# Patient Record
Sex: Male | Born: 1950 | Race: White | Hispanic: No | Marital: Married | Smoking: Former smoker
Health system: Southern US, Community
[De-identification: ages and names within clinical notes are randomized; demographics above are authoritative.]

## PROBLEM LIST (undated history)

## (undated) DIAGNOSIS — E7800 Pure hypercholesterolemia, unspecified: Secondary | ICD-10-CM

## (undated) DIAGNOSIS — K219 Gastro-esophageal reflux disease without esophagitis: Secondary | ICD-10-CM

## (undated) DIAGNOSIS — M1990 Unspecified osteoarthritis, unspecified site: Secondary | ICD-10-CM

## (undated) DIAGNOSIS — L03115 Cellulitis of right lower limb: Secondary | ICD-10-CM

## (undated) DIAGNOSIS — I10 Essential (primary) hypertension: Secondary | ICD-10-CM

## (undated) DIAGNOSIS — T783XXA Angioneurotic edema, initial encounter: Secondary | ICD-10-CM

## (undated) DIAGNOSIS — N200 Calculus of kidney: Secondary | ICD-10-CM

## (undated) HISTORY — DX: Unspecified osteoarthritis, unspecified site: M19.90

## (undated) HISTORY — PX: BACK SURGERY: SHX140

## (undated) HISTORY — PX: TONSILLECTOMY: SUR1361

## (undated) HISTORY — DX: Essential (primary) hypertension: I10

## (undated) HISTORY — PX: LUMBAR DISC SURGERY: SHX700

---

## 1989-07-02 HISTORY — PX: CYSTOSCOPY W/ STONE MANIPULATION: SHX1427

## 1999-10-02 ENCOUNTER — Emergency Department (HOSPITAL_COMMUNITY): Admission: EM | Admit: 1999-10-02 | Discharge: 1999-10-02 | Payer: Self-pay | Admitting: Emergency Medicine

## 2002-08-18 ENCOUNTER — Emergency Department (HOSPITAL_COMMUNITY): Admission: EM | Admit: 2002-08-18 | Discharge: 2002-08-18 | Payer: Self-pay | Admitting: Emergency Medicine

## 2002-08-18 ENCOUNTER — Encounter: Payer: Self-pay | Admitting: Emergency Medicine

## 2007-05-18 ENCOUNTER — Encounter (INDEPENDENT_AMBULATORY_CARE_PROVIDER_SITE_OTHER): Payer: Self-pay | Admitting: Orthopedic Surgery

## 2007-05-18 ENCOUNTER — Ambulatory Visit (HOSPITAL_BASED_OUTPATIENT_CLINIC_OR_DEPARTMENT_OTHER): Admission: RE | Admit: 2007-05-18 | Discharge: 2007-05-18 | Payer: Self-pay | Admitting: Orthopedic Surgery

## 2009-11-24 ENCOUNTER — Ambulatory Visit: Payer: Self-pay | Admitting: Radiology

## 2009-11-24 ENCOUNTER — Emergency Department (HOSPITAL_BASED_OUTPATIENT_CLINIC_OR_DEPARTMENT_OTHER): Admission: EM | Admit: 2009-11-24 | Discharge: 2009-11-24 | Payer: Self-pay | Admitting: Emergency Medicine

## 2011-03-16 NOTE — Op Note (Signed)
[name], [name]              ACCOUNT NO.:  [id number]   MEDICAL RECORD NO.:  [id number]          PATIENT TYPE:  AMB   LOCATION:  DSC                          FACILITY:  [facility]   PHYSICIAN:  Cindee Salt, M.D.       DATE OF BIRTH:  1950/12/17   DATE OF PROCEDURE:  05/18/2007  DATE OF DISCHARGE:                               OPERATIVE REPORT   PREOPERATIVE DIAGNOSIS:  Mass left thumb with degenerative arthritis.   POSTOPERATIVE DIAGNOSIS:  Mass left thumb with degenerative arthritis.   OPERATION:  Excision mass left thumb.   SURGEON:  Cindee Salt, M.D.   ANESTHESIA:  General.   HISTORY:  The patient is a 60 year old male with a history of a mass on  the IP joint of his left thumb.  X-rays reveal degenerative changes of  the IP joint.  He states that this has opened and drained.  This does  transilluminate. 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 He is aware of risks and  complications including infection, recurrence, injury to arteries,  nerves, tendons, incomplete relief of symptoms, and dystrophy.  In the  preoperative area, the patient is seen, the extremity marked by both the  patient and surgeon, questions encouraged and answered.  Antibiotic is  given.   PROCEDURE:  The patient is brought to the operating room where a general  anesthetic was carried out without difficulty.  He was prepped using  DuraPrep, supine position, left arm free.  The limb was exsanguinated  with an Esmarch bandage and tourniquet placed on the forearm which was  inflated to 250 mmHg.  A curvilinear incision was made over the mass and  carried down through the subcutaneous tissue.  This was over the IP  joint.  Bleeders were electrocauterized.  The mass was immediately  encountered.  The stalk going to the skin was identified.  This was  maintained with the mass.  The mass was pearly white. This appeared to  be an epidermal inclusion cyst  rather than mucoid cyst. With blunt and  sharp dissection, it was dissected free.  The joint was not opened.  The  mass was sent to pathology after opening it which revealed, indeed, it  was an epidermal inclusion cyst rather than mucoid cyst.  The wound was  irrigated.  The skin was closed with interrupted 5-0 Vicryl Rapide  sutures.  A sterile compressive dressing was applied.  The patient  tolerated the procedure well and was taken to the recovery room for  observation in satisfactory condition.  He is discharged home to return  to the [facility] in one week on Vicodin.           ______________________________  Cindee Salt, M.D.     GK/MEDQ  D:  05/18/2007  T:  05/18/2007  Job:  [id number]   cc:   Lilyan Punt. Sydnee Levans, M.D.

## 2011-08-16 LAB — POCT HEMOGLOBIN-HEMACUE
Hemoglobin: 18.4 — ABNORMAL HIGH
Operator id: 128471

## 2011-08-16 LAB — BASIC METABOLIC PANEL
BUN: 18
CO2: 29
Calcium: 9.2
Chloride: 107
Creatinine, Ser: 0.84
GFR calc Af Amer: >60
GFR calc non Af Amer: >60
Glucose, Bld: 109 — ABNORMAL HIGH
Potassium: 5.5 — ABNORMAL HIGH
Sodium: 140

## 2013-11-05 ENCOUNTER — Encounter: Payer: Self-pay | Admitting: Family Medicine

## 2013-11-05 ENCOUNTER — Ambulatory Visit (INDEPENDENT_AMBULATORY_CARE_PROVIDER_SITE_OTHER): Payer: No Typology Code available for payment source | Admitting: Family Medicine

## 2013-11-05 VITALS — BP 144/70 | HR 66 | Temp 98.0°F | Resp 16 | Ht 70.5 in | Wt 199.4 lb

## 2013-11-05 DIAGNOSIS — T783XXS Angioneurotic edema, sequela: Secondary | ICD-10-CM

## 2013-11-05 DIAGNOSIS — G8929 Other chronic pain: Secondary | ICD-10-CM

## 2013-11-05 DIAGNOSIS — I10 Essential (primary) hypertension: Secondary | ICD-10-CM | POA: Insufficient documentation

## 2013-11-05 DIAGNOSIS — E7800 Pure hypercholesterolemia, unspecified: Secondary | ICD-10-CM

## 2013-11-05 DIAGNOSIS — M479 Spondylosis, unspecified: Secondary | ICD-10-CM

## 2013-11-05 DIAGNOSIS — M549 Dorsalgia, unspecified: Secondary | ICD-10-CM

## 2013-11-05 DIAGNOSIS — Z23 Encounter for immunization: Secondary | ICD-10-CM

## 2013-11-05 DIAGNOSIS — T783XXA Angioneurotic edema, initial encounter: Secondary | ICD-10-CM

## 2013-11-05 LAB — COMPREHENSIVE METABOLIC PANEL
ALT: 22 U/L (ref 0–53)
AST: 16 U/L (ref 0–37)
Albumin: 4.2 g/dL (ref 3.5–5.2)
Alkaline Phosphatase: 94 U/L (ref 39–117)
BILIRUBIN TOTAL: 0.4 mg/dL (ref 0.3–1.2)
BUN: 17 mg/dL (ref 6–23)
CO2: 26 mEq/L (ref 19–32)
Calcium: 9 mg/dL (ref 8.4–10.5)
Chloride: 103 mEq/L (ref 96–112)
Creat: 0.78 mg/dL (ref 0.50–1.35)
Glucose, Bld: 93 mg/dL (ref 70–99)
Potassium: 4 mEq/L (ref 3.5–5.3)
Sodium: 139 mEq/L (ref 135–145)
Total Protein: 7 g/dL (ref 6.0–8.3)

## 2013-11-05 MED ORDER — EPINEPHRINE 0.3 MG/0.3ML IJ SOAJ: 0.3000 mg | Freq: Once | INTRAMUSCULAR | Status: DC

## 2013-11-05 MED ORDER — AMLODIPINE BESYLATE 10 MG PO TABS: 10.0000 mg | ORAL_TABLET | Freq: Two times a day (BID) | ORAL | Status: DC

## 2013-11-05 MED ORDER — MELOXICAM 15 MG PO TABS: 15.0000 mg | ORAL_TABLET | Freq: Every day | ORAL | Status: DC

## 2013-11-05 MED ORDER — ATORVASTATIN CALCIUM 40 MG PO TABS: 40.0000 mg | ORAL_TABLET | Freq: Every day | ORAL | Status: DC

## 2013-11-05 NOTE — Progress Notes (Signed)
[facility] [address], [place] [address] [phone]  Date:  11/05/2013   Name:  Ray Patel   DOB:  01-23-1951   MRN:  [id number]  PCP:  No primary provider on file.    Chief Complaint: to get establish care and Medication Refill   History of Present Illness:  Ray Patel is a 63 y.o. very pleasant male patient who presents with the following:  Here today as a new patient.  He has had to get a new doctor due to insurance change.  He is a former [employer] patient but will transfer his care to [place].     He has a history of HTN, high cholesterol and arthritis.  Otherwise he is generally heathy.  He is married No tobacco, no drugs, no alcohol  He has had 2 back surgeries-first in the 70's and most recently in 1991 for herniated disks.  He uses meloxicam daily for back pain.   Last blood draw was about 6 months ago- per his recollection everything looked good then.     He does take norvasc 10 mg BID and has done so for a couple of years.  He has not suffered any ill effects from this  Home BP usually 130/70  He does also note occasional angioedema over the last few years.  He does not have an epi- pen. He has not noted any relationship to certain foods, meds or activities.  He will usually just have a little swelling or a couple of hives.  Once did have swelling of his tongue.  benadryl will help.    There are no active problems to display for this patient.   Past Medical History  Diagnosis Date  . Arthritis   . Hypertension     Past Surgical History  Procedure Laterality Date  . Spine surgery      History  Substance Use Topics  . Smoking status: Former Games developer  . Smokeless tobacco: Not on file  . Alcohol Use: No    Family History  Problem Relation Age of Onset  . Heart attack Mother   . COPD Brother     No Known Allergies  Medication list has been reviewed and updated.  No current outpatient prescriptions on file prior to  visit.   No current facility-administered medications on file prior to visit.    Review of Systems:  As per HPI- otherwise negative.   Physical Examination: Filed Vitals:   11/05/13 1108  BP: 144/70  Pulse: 66  Temp: 98 F (36.7 C)  Resp: 16   Filed Vitals:   11/05/13 1108  Height: 5' 10.5" (1.791 m)  Weight: 199 lb 6.4 oz (90.447 kg)   Body mass index is 28.2 kg/(m^2). Ideal Body Weight: Weight in (lb) to have BMI = 25: 176.4  GEN: WDWN, NAD, Non-toxic, A & O x 3, mild overweight, looks well HEENT: Atraumatic, Normocephalic. Neck supple. No masses, No LAD.  Bilateral TM wnl, oropharynx normal.  PEERL,EOMI.   Ears and Nose: No external deformity. CV: RRR, No M/G/R. No JVD. No thrill. No extra heart sounds. PULM: CTA B, no wheezes, crackles, rhonchi. No retractions. No resp. distress. No accessory muscle use. EXTR: No c/c/e NEURO Normal gait.  PSYCH: Normally interactive. Conversant. Not depressed or anxious appearing.  Calm demeanor.   Assessment and Plan: HTN (hypertension) - Plan: amLODipine (NORVASC) 10 MG tablet, Comprehensive metabolic panel  High cholesterol - Plan: atorvastatin (LIPITOR) 40 MG tablet, Comprehensive  metabolic panel  Degenerative joint disease of low back - Plan: meloxicam (MOBIC) 15 MG tablet  Angioedema, initial encounter - Plan: EPINEPHrine (EPIPEN) 0.3 mg/0.3 mL SOAJ injection  Flu shot today.  Refilled his cholesterol, HTN and arthritis meds Per his report his BP is generally well controlled at home Discussed the fact that the usual norvasc max dose is $RemoveBeforeD ID_[id number]$10mg also not sure if it is helping.  Suggested that he try taking just one a day and see if his BP does not go up Discussed occasional angioedema.  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  Gave rx for epipen and discussed when to use it.  For now he declines any further evaluation with allergist.   Signed Abbe AmsterdamJessica Rithika Seel, MD

## 2013-11-05 NOTE — Patient Instructions (Signed)
In general the maximum daily dosage of norvasc (amlodipine) is 10mg  a day.  While taking 20 is probably not harmful I am also not sure if you are getting more benefit from this dose.   You might try taking just 10mg  a day and see how your BP does.

## 2014-11-14 ENCOUNTER — Other Ambulatory Visit: Payer: Self-pay | Admitting: Family Medicine

## 2014-11-18 ENCOUNTER — Other Ambulatory Visit: Payer: Self-pay | Admitting: Family Medicine

## 2014-11-23 ENCOUNTER — Encounter (HOSPITAL_BASED_OUTPATIENT_CLINIC_OR_DEPARTMENT_OTHER): Payer: Self-pay | Admitting: *Deleted

## 2014-11-23 ENCOUNTER — Emergency Department (HOSPITAL_BASED_OUTPATIENT_CLINIC_OR_DEPARTMENT_OTHER)
Admission: EM | Admit: 2014-11-23 | Discharge: 2014-11-23 | Disposition: A | Discharge status: Home / Self Care | Payer: No Typology Code available for payment source | Attending: Emergency Medicine | Admitting: Emergency Medicine

## 2014-11-23 DIAGNOSIS — Z87891 Personal history of nicotine dependence: Secondary | ICD-10-CM | POA: Insufficient documentation

## 2014-11-23 DIAGNOSIS — K122 Cellulitis and abscess of mouth: Secondary | ICD-10-CM | POA: Diagnosis not present

## 2014-11-23 DIAGNOSIS — Z9109 Other allergy status, other than to drugs and biological substances: Secondary | ICD-10-CM | POA: Insufficient documentation

## 2014-11-23 DIAGNOSIS — I10 Essential (primary) hypertension: Secondary | ICD-10-CM | POA: Diagnosis not present

## 2014-11-23 DIAGNOSIS — Z87898 Personal history of other specified conditions: Secondary | ICD-10-CM

## 2014-11-23 DIAGNOSIS — Z79899 Other long term (current) drug therapy: Secondary | ICD-10-CM | POA: Diagnosis not present

## 2014-11-23 DIAGNOSIS — M1990 Unspecified osteoarthritis, unspecified site: Secondary | ICD-10-CM | POA: Diagnosis not present

## 2014-11-23 DIAGNOSIS — R220 Localized swelling, mass and lump, head: Secondary | ICD-10-CM | POA: Diagnosis present

## 2014-11-23 HISTORY — DX: Angioneurotic edema, initial encounter: T78.3XXA

## 2014-11-23 LAB — BASIC METABOLIC PANEL
ANION GAP: 1 — AB (ref 5–15)
BUN: 16 mg/dL (ref 6–23)
CO2: 27 mmol/L (ref 19–32)
Calcium: 7.8 mg/dL — ABNORMAL LOW (ref 8.4–10.5)
Chloride: 107 mmol/L (ref 96–112)
Creatinine, Ser: 0.88 mg/dL (ref 0.50–1.35)
GFR calc Af Amer: >90 mL/min (ref >90)
GFR calc non Af Amer: 88 mL/min — ABNORMAL LOW (ref >90)
Glucose, Bld: 126 mg/dL — ABNORMAL HIGH (ref 70–99)
Potassium: 3.3 mmol/L — ABNORMAL LOW (ref 3.5–5.1)
SODIUM: 135 mmol/L (ref 135–145)

## 2014-11-23 LAB — CBC WITH DIFFERENTIAL/PLATELET
BASOS ABS: 0 10*3/uL (ref 0.0–0.1)
Basophils Relative: 0 % (ref 0–1)
EOS ABS: 0.4 10*3/uL (ref 0.0–0.7)
Eosinophils Relative: 4 % (ref 0–5)
HEMATOCRIT: 43.5 % (ref 39.0–52.0)
Hemoglobin: 14.5 g/dL (ref 13.0–17.0)
LYMPHS PCT: 16 % (ref 12–46)
Lymphs Abs: 1.6 10*3/uL (ref 0.7–4.0)
MCH: 29.4 pg (ref 26.0–34.0)
MCHC: 33.3 g/dL (ref 30.0–36.0)
MCV: 88.1 fL (ref 78.0–100.0)
MONO ABS: 1.2 10*3/uL — AB (ref 0.1–1.0)
MONOS PCT: 11 % (ref 3–12)
NEUTROS ABS: 7 10*3/uL (ref 1.7–7.7)
Neutrophils Relative %: 69 % (ref 43–77)
Platelets: 206 10*3/uL (ref 150–400)
RBC: 4.94 MIL/uL (ref 4.22–5.81)
RDW: 13.3 % (ref 11.5–15.5)
WBC: 10.2 10*3/uL (ref 4.0–10.5)

## 2014-11-23 LAB — RAPID STREP SCREEN (MED CTR MEBANE ONLY): Streptococcus, Group A Screen (Direct): NEGATIVE

## 2014-11-23 MED ORDER — DIPHENHYDRAMINE HCL 50 MG/ML IJ SOLN
25.0000 mg | Freq: Once | INTRAMUSCULAR | Status: AC
  Administered 2014-11-23: 25 mg via INTRAVENOUS
  Filled 2014-11-23: qty 1

## 2014-11-23 MED ORDER — METHYLPREDNISOLONE SODIUM SUCC 125 MG IJ SOLR
125.0000 mg | Freq: Once | INTRAMUSCULAR | Status: AC
  Administered 2014-11-23: 125 mg via INTRAVENOUS
  Filled 2014-11-23: qty 2

## 2014-11-23 MED ORDER — FAMOTIDINE IN NACL 20-0.9 MG/50ML-% IV SOLN
20.0000 mg | Freq: Once | INTRAVENOUS | Status: AC
  Administered 2014-11-23: 20 mg via INTRAVENOUS
  Filled 2014-11-23: qty 50

## 2014-11-23 MED ORDER — PREDNISONE 10 MG PO TABS: 20.0000 mg | ORAL_TABLET | Freq: Two times a day (BID) | ORAL | Status: DC

## 2014-11-23 MED ORDER — EPINEPHRINE HCL 1 MG/ML IJ SOLN
INTRAMUSCULAR | Status: AC
  Administered 2014-11-23: 0.3 mg via SUBCUTANEOUS
  Filled 2014-11-23: qty 1

## 2014-11-23 MED ORDER — SODIUM CHLORIDE 0.9 % IV BOLUS (SEPSIS)
1000.0000 mL | Freq: Once | INTRAVENOUS | Status: AC
  Administered 2014-11-23: 1000 mL via INTRAVENOUS

## 2014-11-23 MED ORDER — EPINEPHRINE 0.3 MG/0.3ML IJ SOAJ
0.3000 mg | Freq: Once | INTRAMUSCULAR | Status: DC
  Filled 2014-11-23: qty 0.6

## 2014-11-23 NOTE — ED Notes (Signed)
C/o throat swelling, relates sx to cough & cold medicine, has had cold sx x1 week, (denies: itching, dizziness or other sx), alert, NAD, calm, interactive, speech altered, airway patent at this time, no drooling, handling secretions, resps e/u, no dyspnea noted, SPO2 low 90-93%. [facility] being established at this time. EDPA notfied and into room. meds ordered.

## 2014-11-23 NOTE — ED Provider Notes (Signed)
CSN: [id number]     Arrival date & time 11/23/14  2053 History   First MD Initiated Contact with Patient 11/23/14 2058     Chief Complaint  Patient presents with  . Allergic Reaction     (Consider location/radiation/quality/duration/timing/severity/associated sxs/prior Treatment) HPI   Ray Patel is a 64 y.o. male complaining of acute onset of throat swelling 45 minutes prior to arrival. Patient denies shortness of breath, rash, dyspepsia. Think this is an allergic reaction to Delsym which he had taken 2 hours prior to arrival. 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 Patient gave himself Benadryl, did not administer epinephrine. Patient states he has intermittent angioedema of different places. States that it normally starts in his left axillary area he's had it in his feet and his testicles sometimes he gets it on the left half of his tongue and lip. His never been given epinephrine in the past. 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 Patient states he's been sick for 2 weeks with runny nose and cough. He denies fever, chills, nausea, vomiting, pharyngitis.   Past Medical History  Diagnosis Date  . Arthritis   . Hypertension   . Angioedema    Past Surgical History  Procedure Laterality Date  . Spine surgery     Family History  Problem Relation Age of Onset  . Heart attack Mother   . COPD Brother    History  Substance Use Topics  . Smoking status: Former Games developer  . Smokeless tobacco: Not on file  . Alcohol Use: No    Review of Systems  10 systems reviewed and found to be negative, except as noted in the HPI.   Allergies  Delsym  Home Medications   Prior to Admission medications   Medication Sig Start Date End Date Taking? Authorizing Provider  amLODipine (NORVASC) 10 MG tablet take 1 tablet by mouth twice a day 11/14/14   Sherren Mocha, MD  atorvastatin (LIPITOR) 40 MG tablet Take 1 tablet (40 mg total) by mouth daily. PATIENT NEEDS  OFFICE VISIT/FASTING LABS FOR ADDITIONAL REFILLS 11/19/14   Pearline Cables, MD  EPINEPHrine (EPIPEN) 0.3 mg/0.3 mL SOAJ injection Inject 0.3 mLs (0.3 mg total) into the muscle once. 11/05/13   Gwenlyn Found Copland, MD  meloxicam (MOBIC) 15 MG tablet Take 1 tablet (15 mg total) by mouth daily. PATIENT NEEDS OFFICE VISIT FOR ADDITIONAL REFILLS 11/19/14   Pearline Cables, MD  PRESCRIPTION MEDICATION Amlodipine Besylate 20 mg taking bid    Historical Provider, MD   BP 145/69 mmHg  Pulse 92  Temp(Src) 98.7 F (37.1 C) (Oral)  Resp 20  Ht  (1.753 m)  Wt 205 lb (92.987 kg)  BMI 30.26 kg/m2  SpO2 93% Physical Exam  Constitutional: He is oriented to person, place, and time. He appears well-developed and well-nourished. No distress.  HENT:  Head: Normocephalic.  Mouth/Throat:    No drooling or stridor.   Soft palate and uvula are extremely edematous and there is swelling on the right posterior to the arch.   No erythema  No tonsillar hypertrophy    Eyes: Conjunctivae and EOM are normal.  Cardiovascular: Normal rate.   Pulmonary/Chest: Effort normal and breath sounds normal. No stridor. No respiratory distress. He has no wheezes. He has no rales. He exhibits no tenderness.  Musculoskeletal: Normal range of motion.  Neurological: He is alert and oriented to person, place, and time.  Psychiatric: He has a normal mood and affect.  Nursing note  and vitals reviewed.   ED Course  Procedures (including critical care time) Labs Review Labs Reviewed - No data to display  Imaging Review No results found.   EKG Interpretation None      MDM   Final diagnoses:  None    Filed Vitals:   11/23/14 2110 11/23/14 2114 11/23/14 2115 11/23/14 2130  BP:   156/72 155/66  Pulse: 78 79 79 79  Temp:      TempSrc:      Resp: 20 19 16 18   Height:      Weight:      SpO2: 92% 95% 95% 97%    Medications  sodium chloride 0.9 % bolus 1,000 mL (1,000 mLs Intravenous New Bag/Given 11/23/14  2109)  methylPREDNISolone sodium succinate (SOLU-MEDROL) 125 mg/2 mL injection 125 mg (125 mg Intravenous Given 11/23/14 2106)  diphenhydrAMINE (BENADRYL) injection 25 mg (25 mg Intravenous Given 11/23/14 2105)  famotidine (PEPCID) IVPB 20 mg (20 mg Intravenous New Bag/Given 11/23/14 2107)  EPINEPHrine (ADRENALIN) 1 MG/ML injection (0.3 mg Subcutaneous Given 11/23/14 2107)    Ray Patel is a pleasant 64 y.o. male presenting with moderate soft palate, uvula edema. Patient is in no respiratory distress. No secondary organ involvement however, considering the location of his symptoms I will give epinephrine in addition to Benadryl, Pepcid, Solu-Medrol.  Allergy cocktail including IM epinephrine did not significantly improve his edema. Patient's rapid strep is negative, CBC with no leukocytosis.  Care remains with Dr. Judd Lienelo at shift change: He will reassess        Wynetta Emeryicole Arlet Marter, PA-C 11/23/14 2203  Geoffery Lyonsouglas Delo, MD 11/24/14 [phone]

## 2014-11-23 NOTE — ED Notes (Signed)
Pt placed on continuous spo2 monitoring and given call bell,  Notified him to call with any worsening in condition.  Pt took 25mg  benadryl 30min pta

## 2014-11-23 NOTE — ED Notes (Signed)
States, "feel about the same and a little better". EDP & EDPA in to see pt. No changes. Alert, NAD, calm, interactive, speech clear, remains stoic. VSS. No dyspnea noted.

## 2014-11-23 NOTE — Discharge Instructions (Signed)
Prednisone as prescribed.  Benadryl 25 mg every 6 hours for the next 3 days.  Return to the emergency department for difficulty breathing, an inability to swallow swallow, or for other new and concerning symptoms.   Uvulitis Uvulitis is redness and soreness (inflammation) of the uvula. The uvula is the small tongue-shaped piece of tissue in the back of your mouth.  CAUSES Infection is a common cause of uvulitis. Infection of the uvula can be either viral or bacterial. Infectious uvulitis usually only occurs in association with another condition, such as inflammation and infection of the mouth or throat. Other causes of uvulitis include:  Trauma to the uvula.  Swelling from excess fluid buildup (edema), which may be an allergic reaction.  Inhalation of irritants, such as chemical agents, smoke, or steam. DIAGNOSIS Your caregiver can usually diagnose uvulitis through a physical examination. Bacterial uvulitis can be diagnosed through the results of the growth of samples of bodily substances taken from your mouth (cultures). HOME CARE INSTRUCTIONS   Rest as much as possible.  Young children may suck on frozen juice bars or frozen ice pops. Older children and adults may gargle with a warm or cold liquid to help soothe the throat. (Mix  tsp of salt in 8 oz of water, or use strong tea.)  Use a cool-mist humidifier to lessen throat irritation and cough.  Drink enough fluids to keep your urine clear or pale yellow.  While the throat is very sore, eat soft or liquid foods such as milk, ice cream, soups, or milk drinks.  Family members who develop a sore throat or fever should have a medical exam or throat culture.  If your child has uvulitis and is taking antibiotic medicine, wait 24 hours or until his or her temperature is near normal (less than 100 F [37.8 C]) before allowing him or her to return to school or day care.  Only take over-the-counter or prescription medicines for pain,  discomfort, or fever as directed by your caregiver. Ask when your test results will be ready. Make sure you get your test results. SEEK MEDICAL CARE IF:   You have an oral temperature above 102 F (38.9 C).  You develop large, tender lumps your the neck.  Your child develops a rash.  You cough up green, yellow-brown, or bloody substances. SEEK IMMEDIATE MEDICAL CARE IF:   You develop any new symptoms, such as vomiting, earache, severe headache, stiff neck, chest pain, or trouble breathing or swallowing.  Your airway is blocked.  You develop more severe throat pain along with drooling or voice changes. Document Released: 05/28/2004 Document Revised: 01/10/2012 Document Reviewed: 12/24/2010 [facility] Patient Information 2015 [place], [place]. This information is not intended to replace advice given to you by your health care provider. Make sure you discuss any questions you have with your health care provider.

## 2014-11-23 NOTE — ED Notes (Signed)
Pt here with "throat swelling".  Pt has swelling that can be seen in her right back of throat.  No acute distress.  Pt notes that he has hx of angioedema and notes also that he took delsym today

## 2014-11-25 ENCOUNTER — Ambulatory Visit (INDEPENDENT_AMBULATORY_CARE_PROVIDER_SITE_OTHER): Payer: No Typology Code available for payment source

## 2014-11-25 ENCOUNTER — Encounter: Payer: Self-pay | Admitting: Family Medicine

## 2014-11-25 ENCOUNTER — Ambulatory Visit (INDEPENDENT_AMBULATORY_CARE_PROVIDER_SITE_OTHER): Payer: No Typology Code available for payment source | Admitting: Family Medicine

## 2014-11-25 ENCOUNTER — Other Ambulatory Visit: Payer: Self-pay | Admitting: Physician Assistant

## 2014-11-25 VITALS — BP 146/73 | HR 71 | Temp 97.9°F | Resp 16 | Ht 71.0 in | Wt 197.0 lb

## 2014-11-25 DIAGNOSIS — R0989 Other specified symptoms and signs involving the circulatory and respiratory systems: Secondary | ICD-10-CM

## 2014-11-25 DIAGNOSIS — Z23 Encounter for immunization: Secondary | ICD-10-CM

## 2014-11-25 DIAGNOSIS — G8929 Other chronic pain: Secondary | ICD-10-CM

## 2014-11-25 DIAGNOSIS — R059 Cough, unspecified: Secondary | ICD-10-CM

## 2014-11-25 DIAGNOSIS — T783XXA Angioneurotic edema, initial encounter: Secondary | ICD-10-CM

## 2014-11-25 DIAGNOSIS — M549 Dorsalgia, unspecified: Secondary | ICD-10-CM

## 2014-11-25 DIAGNOSIS — I10 Essential (primary) hypertension: Secondary | ICD-10-CM

## 2014-11-25 DIAGNOSIS — E7800 Pure hypercholesterolemia, unspecified: Secondary | ICD-10-CM

## 2014-11-25 DIAGNOSIS — Z125 Encounter for screening for malignant neoplasm of prostate: Secondary | ICD-10-CM

## 2014-11-25 DIAGNOSIS — R05 Cough: Secondary | ICD-10-CM

## 2014-11-25 DIAGNOSIS — R739 Hyperglycemia, unspecified: Secondary | ICD-10-CM

## 2014-11-25 DIAGNOSIS — T783XXS Angioneurotic edema, sequela: Secondary | ICD-10-CM

## 2014-11-25 LAB — COMPLETE METABOLIC PANEL WITH GFR
ALBUMIN: 3.9 g/dL (ref 3.5–5.2)
ALT: 23 U/L (ref 0–53)
AST: 19 U/L (ref 0–37)
Alkaline Phosphatase: 83 U/L (ref 39–117)
BUN: 22 mg/dL (ref 6–23)
CALCIUM: 9.2 mg/dL (ref 8.4–10.5)
CO2: 23 mEq/L (ref 19–32)
Chloride: 101 mEq/L (ref 96–112)
Creat: 0.72 mg/dL (ref 0.50–1.35)
GFR, Est African American: >89 mL/min
GLUCOSE: 121 mg/dL — AB (ref 70–99)
Potassium: 3.7 mEq/L (ref 3.5–5.3)
Sodium: 138 mEq/L (ref 135–145)
Total Bilirubin: 0.3 mg/dL (ref 0.2–1.2)
Total Protein: 7.2 g/dL (ref 6.0–8.3)

## 2014-11-25 LAB — LIPID PANEL
CHOL/HDL RATIO: 4.2 ratio
Cholesterol: 158 mg/dL (ref 0–200)
HDL: 38 mg/dL — ABNORMAL LOW (ref >39)
LDL Cholesterol: 96 mg/dL (ref 0–99)
Triglycerides: 119 mg/dL (ref <150)
VLDL: 24 mg/dL (ref 0–40)

## 2014-11-25 LAB — CULTURE, GROUP A STREP

## 2014-11-25 MED ORDER — ALBUTEROL SULFATE (2.5 MG/3ML) 0.083% IN NEBU: 2.5000 mg | INHALATION_SOLUTION | Freq: Once | RESPIRATORY_TRACT | Status: DC

## 2014-11-25 MED ORDER — IPRATROPIUM BROMIDE 0.02 % IN SOLN: 0.5000 mg | Freq: Once | RESPIRATORY_TRACT | Status: DC

## 2014-11-25 MED ORDER — MELOXICAM 15 MG PO TABS: 15.0000 mg | ORAL_TABLET | Freq: Every day | ORAL | Status: DC

## 2014-11-25 MED ORDER — AMLODIPINE BESYLATE 10 MG PO TABS: 10.0000 mg | ORAL_TABLET | Freq: Every day | ORAL | Status: DC

## 2014-11-25 MED ORDER — EPINEPHRINE 0.3 MG/0.3ML IJ SOAJ: 0.3000 mg | Freq: Once | INTRAMUSCULAR | Status: AC

## 2014-11-25 MED ORDER — DOXYCYCLINE HYCLATE 100 MG PO CAPS: 100.0000 mg | ORAL_CAPSULE | Freq: Two times a day (BID) | ORAL | Status: DC

## 2014-11-25 MED ORDER — HYDROCHLOROTHIAZIDE 25 MG PO TABS
12.5000 mg | ORAL_TABLET | Freq: Every day | ORAL | Status: DC
Start: 2014-11-25 — End: 2015-05-11

## 2014-11-25 MED ORDER — BENZONATATE 100 MG PO CAPS: 100.0000 mg | ORAL_CAPSULE | Freq: Three times a day (TID) | ORAL | Status: DC | PRN

## 2014-11-25 MED ORDER — ATORVASTATIN CALCIUM 40 MG PO TABS: 40.0000 mg | ORAL_TABLET | Freq: Every day | ORAL | Status: DC

## 2014-11-25 MED ORDER — ALBUTEROL SULFATE HFA 108 (90 BASE) MCG/ACT IN AERS: 2.0000 | INHALATION_SPRAY | RESPIRATORY_TRACT | Status: DC | PRN

## 2014-11-25 NOTE — Progress Notes (Signed)
Subjective:    Patient ID: Ray Patel, male    DOB: [birth year], 64 y.o.   MRN: [id number]  PCP: No primary care provider on file.  Chief Complaint  Patient presents with  . Medication Refill  . Cough    2 weeks   Patient Active Problem List   Diagnosis Date Noted  . Essential hypertension, benign 11/05/2013  . High cholesterol 11/05/2013  . Chronic back pain 11/05/2013  . Angioedema 11/05/2013   Prior to Admission medications   Medication Sig Start Date End Date Taking? Authorizing Provider  amLODipine (NORVASC) 10 MG tablet Take 1 tablet (10 mg total) by mouth daily. 11/25/14  Yes Bergen Magner, PA  atorvastatin (LIPITOR) 40 MG tablet Take 1 tablet (40 mg total) by mouth daily. 11/25/14  Yes Mahogani Holohan, PA  EPINEPHrine 0.3 mg/0.3 mL IJ SOAJ injection Inject 0.3 mLs (0.3 mg total) into the muscle once. 11/25/14  Yes Kymari Lollis, PA  hydrochlorothiazide (HYDRODIURIL) 25 MG tablet Take 0.5 tablets (12.5 mg total) by mouth daily. 11/25/14  Yes Kimmora Risenhoover, PA  meloxicam (MOBIC) 15 MG tablet Take 1 tablet (15 mg total) by mouth daily. 11/25/14  Yes Theda Payer, PA  predniSONE (DELTASONE) 10 MG tablet Take 2 tablets (20 mg total) by mouth 2 (two) times daily. 11/23/14  Yes Geoffery Lyons, MD  albuterol (PROVENTIL HFA;VENTOLIN HFA) 108 (90 BASE) MCG/ACT inhaler Inhale 2 puffs into the lungs every 4 (four) hours as needed for wheezing or shortness of breath (cough, shortness of breath or wheezing.). 11/25/14   Raelyn Ensign, PA  benzonatate (TESSALON PERLES) 100 MG capsule Take 1 capsule (100 mg total) by mouth 3 (three) times daily as needed for cough. 11/25/14   Raelyn Ensign, PA  doxycycline (VIBRAMYCIN) 100 MG capsule Take 1 capsule (100 mg total) by mouth 2 (two) times daily. 11/25/14   Raelyn Ensign, PA   Medications, allergies, past medical history, surgical history, family history, social history and problem list reviewed and updated.  HPI  23 yom with pmh htn, high cho, and  angioedema presents for med refill.  Has been doing well since we last saw him one year ago. Back has been holding up. 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   When we saw him one year ago he was taking 10 amlodipine twice daily per his previous pcp. He was instructed to decrease to 10 qd. Today he states he has continued to take it bid. He checks his bp both at home and at [facility]. Typically runs 140s-150s. For his job he states needs to have it below 140. BP 146/73 today.   He has had uri past 2 wks that he hasn't been able to get rid of. Mildly prod cough, congestion, rhinorrhea. O2 sat 93% today. He took delysm for it 2 days ago and thinks he had rxn to the med as he has hx of angioedema. He went to ER 2 days ago, had uvular swelling. Tx with steroids, benadryl, epi, and pepcid.   Due for pneumonia, flu, and zoster vaccines. Due for colonoscopy. He is agreeable to flu vaccine but wishes to wait to further discuss the others along with the colonoscopy.   He would like an allergist referral for the angioedema.   Review of Systems No cp, sob, palps, presyncope, syncope, fever, chills.     Objective:   Physical Exam  Constitutional: He is oriented to person, place, and time. He appears well-developed and well-nourished.  Non-toxic appearance. He does not have a sickly appearance. He does not appear ill. No distress.  BP 146/73 mmHg  Pulse 71  Temp(Src) 97.9 F (36.6 C) (Oral)  Resp 16  Ht 5\' 11"  (1.803 m)  Wt 197 lb (89.359 kg)  BMI 27.49 kg/m2  SpO2 93%   HENT:  Right Ear: Tympanic membrane normal.  Left Ear: Tympanic membrane normal.  Nose: Nose normal. Right sinus exhibits no maxillary sinus tenderness and no frontal sinus tenderness. Left sinus exhibits no maxillary sinus tenderness and no frontal sinus tenderness.  Mouth/Throat: Uvula is midline and oropharynx is clear and moist. No oropharyngeal exudate, posterior oropharyngeal edema, posterior  oropharyngeal erythema or tonsillar abscesses.  Neck: Normal range of motion. No JVD present. Carotid bruit is not present.  Cardiovascular: Normal rate, regular rhythm and normal heart sounds.  Exam reveals no gallop.   No murmur heard. Pulmonary/Chest: Effort normal. No accessory muscle usage. No tachypnea. No respiratory distress. He has no decreased breath sounds. He has wheezes in the right lower field and the left lower field. He has rhonchi in the right upper field, the right middle field, the right lower field, the left upper field, the left middle field and the left lower field. He has no rales.  Lymphadenopathy:       Head (right side): No submental, no submandibular and no tonsillar adenopathy present.       Head (left side): No submental, no submandibular and no tonsillar adenopathy present.    He has no cervical adenopathy.  Neurological: He is alert and oriented to person, place, and time.  Psychiatric: He has a normal mood and affect. His speech is normal and behavior is normal.   Albuterol/atrovent neb: Post neb breath sounds with continued diffuse rhonchi and bibasilar wheezing.  2nd albuterol/atrovent neb: Post neb breath sounds with continued diffuse rhonchi, no wheezes. O2 sat 99%.   [facility] reading (PRIMARY) by  Dr. Patsy Lageropland. Findings: Increased haziness right lower lobe. No other acute abnormality.      Assessment & Plan:   [age] yom with pmh htn, high cho, and angioedema presents for med refill.  Abnormal lung sounds - Plan: albuterol (PROVENTIL) (2.5 MG/3ML) 0.083% nebulizer solution 2.5 mg, ipratropium (ATROVENT) nebulizer solution 0.5 mg, albuterol (PROVENTIL) (2.5 MG/3ML) 0.083% nebulizer solution 2.5 mg, ipratropium (ATROVENT) nebulizer solution 0.5 mg, DG Chest 2 View, doxycycline (VIBRAMYCIN) 100 MG capsule, albuterol (PROVENTIL HFA;VENTOLIN HFA) 108 (90 BASE) MCG/ACT inhaler Cough - Plan: benzonatate (TESSALON PERLES) 100 MG capsule, doxycycline (VIBRAMYCIN) 100 MG  capsule --2 nebs today with improved lung sounds and 02 sat --albuterol prn for home for wheezing --diffuse rhonchi, possible hazy area on cxr rll, initial 02 sat 93%, 2 wks sx --> tx with doxy 10 days --tessalon for cough --discuss pneumonia vaccine next visit --rtc 4-5 days if not improving  Angioedema, sequela - Plan: Ambulatory referral to Allergy --sent in refill script for epipen  Need for prophylactic vaccination and inoculation against influenza - Plan: Flu Vaccine QUAD 36+ mos IM  High cholesterol - Plan: atorvastatin (LIPITOR) 40 MG tablet, Lipid panel  Essential hypertension - Plan: amLODipine (NORVASC) 10 MG tablet, hydrochlorothiazide (HYDRODIURIL) 25 MG tablet, COMPLETE METABOLIC PANEL WITH GFR --decrease amlodipine to 10 mg qd --start hctz 12.5 mg qd but await labs as last k was decreased, pt instructed to take bp at home and record 3x/week --increase hctz to 25 mg qd if running above 140/90 --no ace/arb at this time with hx angioedema  Chronic back  pain - Plan: meloxicam (MOBIC) 15 MG tablet  Screening for prostate cancer - Plan: PSA  Donnajean Lopes, PA-C Physician Assistant-Certified [facility]  11/25/2014 6:04 PM

## 2014-11-25 NOTE — Patient Instructions (Addendum)
We referred you to an allergist today. They will be in contact with you to schedule an appointment.  You received the flu vaccine today.  We refilled your medications today. Please take the amlodipine only once daily.  Please don't start taking the hctz until we get your labs back and tell you this is ok. We drew 3 labs today and will be in touch in a couple days with these. If your labs are normal and you start the hctz, we may increase this to 25 mg once daily for better bp control depending on if your top number is running above 140. It may help to pick up a new bp cuff for home so you can monitor this a few times a week.  Please take the cough medicine up to every 8 hours as needed for cough. You got 2 breathing treatments today since your lungs sounded abnormal.  Your chest xray showed a possible small pneumonia on the right side. We will await the radiology read. Please take the doxycycline twice daily for 10 days.  Please come back to clinic in 4-5 days if you're not feeling better with the cough.  We'll plan to see you back in 6 months for follow up. Possible pneumonia vaccine at that time along with colonoscopy referral.

## 2014-11-26 LAB — HEMOGLOBIN A1C
Hgb A1c MFr Bld: 6.2 % — ABNORMAL HIGH (ref <5.7)
Mean Plasma Glucose: 131 mg/dL — ABNORMAL HIGH (ref <117)

## 2014-11-26 LAB — PSA: PSA: 1.24 ng/mL (ref <=4.00)

## 2015-01-04 ENCOUNTER — Other Ambulatory Visit: Payer: Self-pay | Admitting: Family Medicine

## 2015-01-11 ENCOUNTER — Encounter: Payer: Self-pay | Admitting: Family Medicine

## 2015-01-15 ENCOUNTER — Telehealth: Payer: Self-pay

## 2015-01-15 NOTE — Telephone Encounter (Signed)
Called [facility] to add addition refills per Dr. Cyndie Chimeopland's note. Pt notified.

## 2015-01-15 NOTE — Telephone Encounter (Addendum)
Pt would like to speak with someone regarding his medication, states he had 11 refills but was denied when he went to the pharmacy Please call [phone]. It is his AMLODIPINE    [facility] ON [address]

## 2015-03-06 ENCOUNTER — Telehealth: Payer: Self-pay

## 2015-03-06 NOTE — Telephone Encounter (Signed)
Pharm faxed req to change Rx for HCTZ 25 mg 1/2 tablet BID, to 12.5 mg 1 tab BID per pt request. When I checked OV notes from 11/25/14 [place] instr's [place] are copied here: --start hctz 12.5 mg qd but await labs as last k was decreased, pt instructed to take bp at home and record 3x/week --increase hctz to 25 mg qd if running above 140/90  LMOM for pt to CB. We need to know how pt has been taking, if he started at 1/2 tab of 25 mg QD and was getting BP readings over 140/90? If so, instr's were to increase to 25 mg QD, not to continue cutting in half and taking BID. What are pt's current BP readings on what he is taking now?

## 2015-04-21 ENCOUNTER — Emergency Department
Admission: EM | Admit: 2015-04-21 | Discharge: 2015-04-21 | Disposition: A | Discharge status: Home / Self Care | Payer: Worker's Compensation | Attending: Emergency Medicine | Admitting: Emergency Medicine

## 2015-04-21 ENCOUNTER — Encounter: Payer: Self-pay | Admitting: *Deleted

## 2015-04-21 DIAGNOSIS — S81811A Laceration without foreign body, right lower leg, initial encounter: Secondary | ICD-10-CM | POA: Insufficient documentation

## 2015-04-21 DIAGNOSIS — S0101XA Laceration without foreign body of scalp, initial encounter: Secondary | ICD-10-CM | POA: Diagnosis not present

## 2015-04-21 DIAGNOSIS — Z791 Long term (current) use of non-steroidal anti-inflammatories (NSAID): Secondary | ICD-10-CM | POA: Diagnosis not present

## 2015-04-21 DIAGNOSIS — Y9289 Other specified places as the place of occurrence of the external cause: Secondary | ICD-10-CM | POA: Diagnosis not present

## 2015-04-21 DIAGNOSIS — Y990 Civilian activity done for income or pay: Secondary | ICD-10-CM | POA: Insufficient documentation

## 2015-04-21 DIAGNOSIS — W01198A Fall on same level from slipping, tripping and stumbling with subsequent striking against other object, initial encounter: Secondary | ICD-10-CM | POA: Insufficient documentation

## 2015-04-21 DIAGNOSIS — Y9389 Activity, other specified: Secondary | ICD-10-CM | POA: Diagnosis not present

## 2015-04-21 DIAGNOSIS — Z792 Long term (current) use of antibiotics: Secondary | ICD-10-CM | POA: Diagnosis not present

## 2015-04-21 DIAGNOSIS — Z87891 Personal history of nicotine dependence: Secondary | ICD-10-CM | POA: Diagnosis not present

## 2015-04-21 DIAGNOSIS — I10 Essential (primary) hypertension: Secondary | ICD-10-CM | POA: Diagnosis not present

## 2015-04-21 DIAGNOSIS — Z79899 Other long term (current) drug therapy: Secondary | ICD-10-CM | POA: Diagnosis not present

## 2015-04-21 DIAGNOSIS — Z7952 Long term (current) use of systemic steroids: Secondary | ICD-10-CM | POA: Diagnosis not present

## 2015-04-21 DIAGNOSIS — Z23 Encounter for immunization: Secondary | ICD-10-CM | POA: Insufficient documentation

## 2015-04-21 MED ORDER — BACITRACIN ZINC 500 UNIT/GM EX OINT
TOPICAL_OINTMENT | CUTANEOUS | Status: AC
  Administered 2015-04-21: 1
  Filled 2015-04-21: qty 1.8

## 2015-04-21 MED ORDER — LIDOCAINE-EPINEPHRINE (PF) 1 %-1:200000 IJ SOLN
INTRAMUSCULAR | Status: AC
  Administered 2015-04-21: 30 mL
  Filled 2015-04-21: qty 30

## 2015-04-21 MED ORDER — TETANUS-DIPHTH-ACELL PERTUSSIS 5-2.5-18.5 LF-MCG/0.5 IM SUSP
INTRAMUSCULAR | Status: AC
  Administered 2015-04-21: 1 mL via INTRAMUSCULAR
  Filled 2015-04-21: qty 0.5

## 2015-04-21 NOTE — ED Provider Notes (Signed)
[facility] Emergency Department Provider Note  ____________________________________________  Time seen: 9:50 AM  I have reviewed the triage vital signs and the nursing notes.   HISTORY  Chief Complaint Head Laceration    HPI Ray Patel is a 64 y.o. male reports a mechanical trip and fall at work today. He hit his right shin and the right side of his forehead causing lacerations and bleeding. He denies any dizziness neck pain headache or syncope. He did not not have any loss of consciousness. Denies any vision changes nausea vomiting numbness tingling or weakness at this time. He otherwise feels fine except for pain at the laceration sites. He is able to bear weight on his right leg and has normal range of motion.     Past Medical History  Diagnosis Date  . Arthritis   . Hypertension   . Angioedema     Patient Active Problem List   Diagnosis Date Noted  . Essential hypertension, benign 11/05/2013  . High cholesterol 11/05/2013  . Chronic back pain 11/05/2013  . Angioedema 11/05/2013    Past Surgical History  Procedure Laterality Date  . Spine surgery      Current Outpatient Rx  Name  Route  Sig  Dispense  Refill  . albuterol (PROVENTIL HFA;VENTOLIN HFA) 108 (90 BASE) MCG/ACT inhaler   Inhalation   Inhale 2 puffs into the lungs every 4 (four) hours as needed for wheezing or shortness of breath (cough, shortness of breath or wheezing.).   1 Inhaler   1   . amLODipine (NORVASC) 10 MG tablet   Oral   Take 1 tablet (10 mg total) by mouth daily.   30 tablet   11   . atorvastatin (LIPITOR) 40 MG tablet   Oral   Take 1 tablet (40 mg total) by mouth daily.   30 tablet   11   . benzonatate (TESSALON PERLES) 100 MG capsule   Oral   Take 1 capsule (100 mg total) by mouth 3 (three) times daily as needed for cough.   20 capsule   0   . doxycycline (VIBRAMYCIN) 100 MG capsule   Oral   Take 1 capsule (100 mg total) by mouth 2 (two)  times daily.   20 capsule   0   . EPINEPHrine 0.3 mg/0.3 mL IJ SOAJ injection   Intramuscular   Inject 0.3 mLs (0.3 mg total) into the muscle once.   2 Device   2   . hydrochlorothiazide (HYDRODIURIL) 25 MG tablet   Oral   Take 0.5 tablets (12.5 mg total) by mouth daily.   90 tablet   3   . meloxicam (MOBIC) 15 MG tablet   Oral   Take 1 tablet (15 mg total) by mouth daily.   30 tablet   11   . predniSONE (DELTASONE) 10 MG tablet   Oral   Take 2 tablets (20 mg total) by mouth 2 (two) times daily.   20 tablet   0     Allergies Delsym  Family History  Problem Relation Age of Onset  . Heart attack Mother   . COPD Brother     Social History History  Substance Use Topics  . Smoking status: Former Games developer  . Smokeless tobacco: Not on file  . Alcohol Use: No    Review of Systems  Constitutional: No fever or chills. No weight changes Eyes:No blurry vision or double vision.  ENT: No sore throat. Cardiovascular: No chest pain. Respiratory: No  dyspnea or cough. Gastrointestinal: Negative for abdominal pain, vomiting and diarrhea.  No BRBPR or melena. Genitourinary: Negative for dysuria, urinary retention, bloody urine, or difficulty urinating. Musculoskeletal: Negative for back pain. No joint swelling or pain. Skin: Negative for rash. Neurological: Negative for headaches, focal weakness or numbness. Psychiatric:No anxiety or depression.   Endocrine:No hot/cold intolerance, changes in energy, or sleep difficulty.  10-point ROS otherwise negative.  ____________________________________________   PHYSICAL EXAM:  VITAL SIGNS: ED Triage Vitals  Enc Vitals Group     BP 04/21/15 0958 163/85 mmHg     Pulse Rate 04/21/15 0958 65     Resp --      Temp 04/21/15 0958 98 F (36.7 C)     Temp Source 04/21/15 0958 Oral     SpO2 04/21/15 0958 97 %     Weight 04/21/15 0958 218 lb (98.884 kg)     Height --      Head Cir --      Peak Flow --      Pain Score  04/21/15 0959 7     Pain Loc --      Pain Edu? --      Excl. in GC? --      Constitutional: Alert and oriented. Well appearing and in no distress. Eyes: No scleral icterus. No conjunctival pallor. PERRL. EOMI ENT   Head: Normocephalic with 5 cm linear laceration running vertically on the right forehead. It does not involve the hair line or the eyebrow. The wound is hemostatic after holding direct pressure for 1 minute   Nose: No congestion/rhinnorhea. No septal hematoma   Mouth/Throat: MMM, no pharyngeal erythema. No peritonsillar mass. No uvula shift.   Neck: No stridor. No SubQ emphysema. No meningismus. No midline tenderness, full range of motion Hematological/Lymphatic/Immunilogical: No cervical lymphadenopathy. Cardiovascular: RRR. Normal and symmetric distal pulses are present in all extremities. No murmurs, rubs, or gallops. Respiratory: Normal respiratory effort without tachypnea nor retractions. Breath sounds are clear and equal bilaterally. No wheezes/rales/rhonchi. Gastrointestinal: Soft and nontender. No distention. There is no CVA tenderness.  No rebound, rigidity, or guarding. Genitourinary: deferred Musculoskeletal: Nontender with normal range of motion in all extremities. No joint effusions.  No lower extremity tenderness.  No edema. 2 cm linear laceration overlying the anterior midshaft tibia. No deformity no bony tenderness hemostatic. Neurologic:   Normal speech and language.  CN 2-10 normal. Motor grossly intact. No pronator drift.  Normal gait. No gross focal neurologic deficits are appreciated.  Skin:  Skin is warm, dry and intact. No rash noted.  No petechiae, purpura, or bullae. Psychiatric: Mood and affect are normal. Speech and behavior are normal. Patient exhibits appropriate insight and judgment.  ____________________________________________    LABS (pertinent positives/negatives) (all labs ordered are listed, but only abnormal results are  displayed) Labs Reviewed - No data to display ____________________________________________   EKG    ____________________________________________    RADIOLOGY    ____________________________________________   PROCEDURES LACERATION REPAIR Performed by: Sharman Cheek Authorized by: Sharman Cheek Consent: Verbal consent obtained. Risks and benefits: risks, benefits and alternatives were discussed Consent given by: patient Patient identity confirmed: provided demographic data Prepped and Draped in normal sterile fashion Wound explored to base and bloodless field, no foreign body identified  Laceration Location: Right forehead  Laceration Length: 5 cm  No Foreign Bodies seen or palpated  Anesthesia: local infiltration  Local anesthetic: lidocaine 1 % with epinephrine  Anesthetic total: 3 ml  Irrigation method: syringe Amount of cleaning: standard  Skin closure: Monocryl   Number of sutures: 3   Technique: Simple interrupted   Patient tolerance: Patient tolerated the procedure well with no immediate complications.    LACERATION REPAIR Performed by: Sharman Cheek Authorized by: Sharman Cheek Consent: Verbal consent obtained. Risks and benefits: risks, benefits and alternatives were discussed Consent given by: patient Patient identity confirmed: provided demographic data Prepped and Draped in normal sterile fashion Wound explored to base and bloodless field, no foreign body identified. Wound hemostatic.  Laceration Location: Right shin  Laceration Length: 2 cm  No Foreign Bodies seen or palpated  Anesthesia: local infiltration  Local anesthetic: lidocaine 1 % with epinephrine  Anesthetic total: 1 ml  Irrigation method: syringe Amount of cleaning: standard  Skin closure: Monocryl   Number of sutures: 2   Technique: Simple interrupted   Patient tolerance: Patient tolerated the procedure well with no immediate  complications.  ____________________________________________   INITIAL IMPRESSION / ASSESSMENT AND PLAN / ED COURSE  Pertinent labs & imaging results that were available during my care of the patient were reviewed by me and considered in my medical decision making (see chart for details).  No evidence of fractures, no evidence of arterial or significant vascular injury. No foreign body retained in the wounds. They were irrigated and cleansed and repaired. Tetanus was updated with a vaccination given today. No other symptoms, no imaging necessary. We'll discharge the patient no restrictions May return to work tomorrow.     ____________________________________________   FINAL CLINICAL IMPRESSION(S) / ED DIAGNOSES  Final diagnoses:  Scalp laceration, initial encounter  Laceration of lower extremity, right, initial encounter      Sharman Cheek, MD 04/21/15 1027

## 2015-04-21 NOTE — Discharge Instructions (Signed)
Head Injury °You have received a head injury. It does not appear serious at this time. Headaches and vomiting are common following head injury. It should be easy to awaken from sleeping. Sometimes it is necessary for you to stay in the emergency department for a while for observation. Sometimes admission to the hospital may be needed. After injuries such as yours, most problems occur within the first 24 hours, but side effects may occur up to 7-10 days after the injury. It is important for you to carefully monitor your condition and contact your health care provider or seek immediate medical care if there is a change in your condition. °WHAT ARE THE TYPES OF HEAD INJURIES? °Head injuries can be as minor as a bump. Some head injuries can be more severe. More severe head injuries include: °· A jarring injury to the brain (concussion). °· A bruise of the brain (contusion). This mean there is bleeding in the brain that can cause swelling. °· A cracked skull (skull fracture). °· Bleeding in the brain that collects, clots, and forms a bump (hematoma). °WHAT CAUSES A HEAD INJURY? °A serious head injury is most likely to happen to someone who is in a car wreck and is not wearing a seat belt. Other causes of major head injuries include bicycle or motorcycle accidents, sports injuries, and falls. °HOW ARE HEAD INJURIES DIAGNOSED? °A complete history of the event leading to the injury and your current symptoms will be helpful in diagnosing head injuries. Many times, pictures of the brain, such as CT or MRI are needed to see the extent of the injury. Often, an overnight hospital stay is necessary for observation.  °WHEN SHOULD I SEEK IMMEDIATE MEDICAL CARE?  °You should get help right away if: °· You have confusion or drowsiness. °· You feel sick to your stomach (nauseous) or have continued, forceful vomiting. °· You have dizziness or unsteadiness that is getting worse. °· You have severe, continued headaches not relieved by  medicine. Only take over-the-counter or prescription medicines for pain, fever, or discomfort as directed by your health care provider. °· You do not have normal function of the arms or legs or are unable to walk. °· You notice changes in the black spots in the center of the colored part of your eye (pupil). °· You have a clear or bloody fluid coming from your nose or ears. °· You have a loss of vision. °During the next 24 hours after the injury, you must stay with someone who can watch you for the warning signs. This person should contact local emergency services (911 in the [place] if you have seizures, you become unconscious, or you are unable to wake up. °HOW CAN I PREVENT A HEAD INJURY IN THE FUTURE? °The most important factor for preventing major head injuries is avoiding motor vehicle accidents.  To minimize the potential for damage to your head, it is crucial to wear seat belts while riding in motor vehicles. Wearing helmets while bike riding and playing collision sports (like football) is also helpful. Also, avoiding dangerous activities around the house will further help reduce your risk of head injury.  °WHEN CAN I RETURN TO NORMAL ACTIVITIES AND ATHLETICS? °You should be reevaluated by your health care provider before returning to these activities. If you have any of the following symptoms, you should not return to activities or contact sports until 1 week after the symptoms have stopped: °· Persistent headache. °· Dizziness or vertigo. °· Poor attention and concentration. °· Confusion. °·   Memory problems.  Nausea or vomiting.  Fatigue or tire easily.  Irritability.  Intolerant of bright lights or loud noises.  Anxiety or depression.  Disturbed sleep. MAKE SURE YOU:   Understand these instructions.  Will watch your condition.  Will get help right away if you are not doing well or get worse. Document Released: 10/18/2005 Document Revised: 10/23/2013 Document Reviewed:  06/25/2013 [facility] Patient Information 2015 [place], [place]. This information is not intended to replace advice given to you by your health care provider. Make sure you discuss any questions you have with your health care provider.  Laceration Care, Adult A laceration is a cut or lesion that goes through all layers of the skin and into the tissue just beneath the skin. TREATMENT  Some lacerations may not require closure. Some lacerations may not be able to be closed due to an increased risk of infection. It is important to see your caregiver as soon as possible after an injury to minimize the risk of infection and maximize the opportunity for successful closure. If closure is appropriate, pain medicines may be given, if needed. The wound will be cleaned to help prevent infection. Your caregiver will use stitches (sutures), staples, wound glue (adhesive), or skin adhesive strips to repair the laceration. These tools bring the skin edges together to allow for faster healing and a better cosmetic outcome. However, all wounds will heal with a scar. Once the wound has healed, scarring can be minimized by covering the wound with sunscreen during the day for 1 full year. HOME CARE INSTRUCTIONS  For sutures or staples:  Keep the wound clean and dry.  If you were given a bandage (dressing), you should change it at least once a day. Also, change the dressing if it becomes wet or dirty, or as directed by your caregiver.  Wash the wound with soap and water 2 times a day. Rinse the wound off with water to remove all soap. Pat the wound dry with a clean towel.  After cleaning, apply a thin layer of the antibiotic ointment as recommended by your caregiver. This will help prevent infection and keep the dressing from sticking.  You may shower as usual after the first 24 hours. Do not soak the wound in water until the sutures are removed.  Only take over-the-counter or prescription medicines for pain,  discomfort, or fever as directed by your caregiver.  Get your sutures or staples removed as directed by your caregiver. For skin adhesive strips:  Keep the wound clean and dry.  Do not get the skin adhesive strips wet. You may bathe carefully, using caution to keep the wound dry.  If the wound gets wet, pat it dry with a clean towel.  Skin adhesive strips will fall off on their own. You may trim the strips as the wound heals. Do not remove skin adhesive strips that are still stuck to the wound. They will fall off in time. For wound adhesive:  You may briefly wet your wound in the shower or bath. Do not soak or scrub the wound. Do not swim. Avoid periods of heavy perspiration until the skin adhesive has fallen off on its own. After showering or bathing, gently pat the wound dry with a clean towel.  Do not apply liquid medicine, cream medicine, or ointment medicine to your wound while the skin adhesive is in place. This may loosen the film before your wound is healed.  If a dressing is placed over the wound, be careful not  to apply tape directly over the skin adhesive. This may cause the adhesive to be pulled off before the wound is healed.  Avoid prolonged exposure to sunlight or tanning lamps while the skin adhesive is in place. Exposure to ultraviolet light in the first year will darken the scar.  The skin adhesive will usually remain in place for 5 to 10 days, then naturally fall off the skin. Do not pick at the adhesive film. You may need a tetanus shot if:  You cannot remember when you had your last tetanus shot.  You have never had a tetanus shot. If you get a tetanus shot, your arm may swell, get red, and feel warm to the touch. This is common and not a problem. If you need a tetanus shot and you choose not to have one, there is a rare chance of getting tetanus. Sickness from tetanus can be serious. SEEK MEDICAL CARE IF:   You have redness, swelling, or increasing pain in the  wound.  You see a red line that goes away from the wound.  You have yellowish-white fluid (pus) coming from the wound.  You have a fever.  You notice a bad smell coming from the wound or dressing.  Your wound breaks open before or after sutures have been removed.  You notice something coming out of the wound such as wood or glass.  Your wound is on your hand or foot and you cannot move a finger or toe. SEEK IMMEDIATE MEDICAL CARE IF:   Your pain is not controlled with prescribed medicine.  You have severe swelling around the wound causing pain and numbness or a change in color in your arm, hand, leg, or foot.  Your wound splits open and starts bleeding.  You have worsening numbness, weakness, or loss of function of any joint around or beyond the wound.  You develop painful lumps near the wound or on the skin anywhere on your body. MAKE SURE YOU:   Understand these instructions.  Will watch your condition.  Will get help right away if you are not doing well or get worse. Document Released: 10/18/2005 Document Revised: 01/10/2012 Document Reviewed: 04/13/2011 [facility] Patient Information 2015 [place], [place]. This information is not intended to replace advice given to you by your health care provider. Make sure you discuss any questions you have with your health care provider.  Stitches, Staples, or Skin Adhesive Strips  Stitches (sutures), staples, and skin adhesive strips hold the skin together as it heals. They will usually be in place for 7 days or less. HOME CARE  Wash your hands with soap and water before and after you touch your wound.  Only take medicine as told by your doctor.  Cover your wound only if your doctor told you to. Otherwise, leave it open to air.  Do not get your stitches wet or dirty. If they get dirty, dab them gently with a clean washcloth. Wet the washcloth with soapy water. Do not rub. Pat them dry gently.  Do not put medicine or medicated  cream on your stitches unless your doctor told you to.  Do not take out your own stitches or staples. Skin adhesive strips will fall off by themselves.  Do not pick at the wound. Picking can cause an infection.  Do not miss your follow-up appointment.  If you have problems or questions, call your doctor. GET HELP RIGHT AWAY IF:   You have a temperature by mouth above 102 F (38.9 C), not  controlled by medicine.  You have chills.  You have redness or pain around your stitches.  There is puffiness (swelling) around your stitches.  You notice fluid (drainage) from your stitches.  There is a bad smell coming from your wound. MAKE SURE YOU:  Understand these instructions.  Will watch your condition.  Will get help if you are not doing well or get worse. Document Released: 08/15/2009 Document Revised: 01/10/2012 Document Reviewed: 08/15/2009 [facility] Patient Information 2015 [place]. This information is not intended to replace advice given to you by your health care provider. Make sure you discuss any questions you have with your health care provider.

## 2015-05-02 ENCOUNTER — Other Ambulatory Visit: Payer: Self-pay

## 2015-05-02 ENCOUNTER — Emergency Department
Admission: EM | Admit: 2015-05-02 | Discharge: 2015-05-02 | Disposition: A | Discharge status: Home / Self Care | Payer: Worker's Compensation | Attending: Emergency Medicine | Admitting: Emergency Medicine

## 2015-05-02 ENCOUNTER — Emergency Department: Payer: Worker's Compensation

## 2015-05-02 DIAGNOSIS — Z792 Long term (current) use of antibiotics: Secondary | ICD-10-CM | POA: Diagnosis not present

## 2015-05-02 DIAGNOSIS — L03115 Cellulitis of right lower limb: Secondary | ICD-10-CM | POA: Diagnosis not present

## 2015-05-02 DIAGNOSIS — I10 Essential (primary) hypertension: Secondary | ICD-10-CM | POA: Insufficient documentation

## 2015-05-02 DIAGNOSIS — Z87891 Personal history of nicotine dependence: Secondary | ICD-10-CM | POA: Diagnosis not present

## 2015-05-02 DIAGNOSIS — R509 Fever, unspecified: Secondary | ICD-10-CM | POA: Diagnosis present

## 2015-05-02 DIAGNOSIS — Z79899 Other long term (current) drug therapy: Secondary | ICD-10-CM | POA: Diagnosis not present

## 2015-05-02 LAB — COMPREHENSIVE METABOLIC PANEL
ALK PHOS: 79 U/L (ref 38–126)
ALT: 33 U/L (ref 17–63)
ANION GAP: 13 (ref 5–15)
AST: 34 U/L (ref 15–41)
Albumin: 4 g/dL (ref 3.5–5.0)
BUN: 25 mg/dL — AB (ref 6–20)
CO2: 24 mmol/L (ref 22–32)
CREATININE: 1.3 mg/dL — AB (ref 0.61–1.24)
Calcium: 9.2 mg/dL (ref 8.9–10.3)
Chloride: 103 mmol/L (ref 101–111)
GFR calc non Af Amer: 56 mL/min — ABNORMAL LOW (ref >60)
Glucose, Bld: 106 mg/dL — ABNORMAL HIGH (ref 65–99)
POTASSIUM: 3.9 mmol/L (ref 3.5–5.1)
Sodium: 140 mmol/L (ref 135–145)
TOTAL PROTEIN: 7.8 g/dL (ref 6.5–8.1)
Total Bilirubin: 0.4 mg/dL (ref 0.3–1.2)

## 2015-05-02 LAB — CBC WITH DIFFERENTIAL/PLATELET
BASOS ABS: 0.1 10*3/uL (ref 0–0.1)
BASOS PCT: 0 %
EOS ABS: 0.2 10*3/uL (ref 0–0.7)
EOS PCT: 1 %
HEMATOCRIT: 46.1 % (ref 40.0–52.0)
HEMOGLOBIN: 15.3 g/dL (ref 13.0–18.0)
Lymphocytes Relative: 8 %
Lymphs Abs: 1.5 10*3/uL (ref 1.0–3.6)
MCH: 29.4 pg (ref 26.0–34.0)
MCHC: 33.3 g/dL (ref 32.0–36.0)
MCV: 88.4 fL (ref 80.0–100.0)
MONOS PCT: 6 %
Monocytes Absolute: 1.1 10*3/uL — ABNORMAL HIGH (ref 0.2–1.0)
Neutro Abs: 14.8 10*3/uL — ABNORMAL HIGH (ref 1.4–6.5)
Neutrophils Relative %: 85 %
Platelets: 190 10*3/uL (ref 150–440)
RBC: 5.21 MIL/uL (ref 4.40–5.90)
RDW: 13.3 % (ref 11.5–14.5)
WBC: 17.6 10*3/uL — AB (ref 3.8–10.6)

## 2015-05-02 LAB — URINALYSIS COMPLETE WITH MICROSCOPIC (ARMC ONLY)
BACTERIA UA: NONE SEEN
Bilirubin Urine: NEGATIVE
Glucose, UA: NEGATIVE mg/dL
HGB URINE DIPSTICK: NEGATIVE
KETONES UR: NEGATIVE mg/dL
Leukocytes, UA: NEGATIVE
Nitrite: NEGATIVE
Protein, ur: NEGATIVE mg/dL
Specific Gravity, Urine: 1.023 (ref 1.005–1.030)
Squamous Epithelial / LPF: NONE SEEN
pH: 5 (ref 5.0–8.0)

## 2015-05-02 LAB — BASIC METABOLIC PANEL
Anion gap: 8 (ref 5–15)
BUN: 24 mg/dL — ABNORMAL HIGH (ref 6–20)
CALCIUM: 8.1 mg/dL — AB (ref 8.9–10.3)
CO2: 25 mmol/L (ref 22–32)
CREATININE: 1.14 mg/dL (ref 0.61–1.24)
Chloride: 107 mmol/L (ref 101–111)
GFR calc Af Amer: >60 mL/min (ref >60)
GFR calc non Af Amer: >60 mL/min (ref >60)
Glucose, Bld: 113 mg/dL — ABNORMAL HIGH (ref 65–99)
POTASSIUM: 3.3 mmol/L — AB (ref 3.5–5.1)
Sodium: 140 mmol/L (ref 135–145)

## 2015-05-02 LAB — LACTIC ACID, PLASMA
LACTIC ACID, VENOUS: 1.7 mmol/L (ref 0.5–2.0)
Lactic Acid, Venous: 1.4 mmol/L (ref 0.5–2.0)

## 2015-05-02 MED ORDER — ACETAMINOPHEN 325 MG PO TABS
650.0000 mg | ORAL_TABLET | Freq: Once | ORAL | Status: AC
  Administered 2015-05-02: 650 mg via ORAL

## 2015-05-02 MED ORDER — CEFTRIAXONE SODIUM IN DEXTROSE 20 MG/ML IV SOLN
INTRAVENOUS | Status: AC
  Filled 2015-05-02: qty 50

## 2015-05-02 MED ORDER — IBUPROFEN 600 MG PO TABS
600.0000 mg | ORAL_TABLET | Freq: Once | ORAL | Status: AC
  Administered 2015-05-02: 600 mg via ORAL

## 2015-05-02 MED ORDER — IBUPROFEN 600 MG PO TABS
ORAL_TABLET | ORAL | Status: AC
  Administered 2015-05-02: 600 mg via ORAL
  Filled 2015-05-02: qty 1

## 2015-05-02 MED ORDER — VANCOMYCIN HCL IN DEXTROSE 1-5 GM/200ML-% IV SOLN: 1000.0000 mg | Freq: Once | INTRAVENOUS | Status: DC

## 2015-05-02 MED ORDER — SODIUM CHLORIDE 0.9 % IV BOLUS (SEPSIS)
1000.0000 mL | INTRAVENOUS | Status: AC
  Administered 2015-05-02: 1000 mL via INTRAVENOUS

## 2015-05-02 MED ORDER — SULFAMETHOXAZOLE-TRIMETHOPRIM 800-160 MG PO TABS
ORAL_TABLET | ORAL | Status: AC
  Filled 2015-05-02: qty 2

## 2015-05-02 MED ORDER — PIPERACILLIN-TAZOBACTAM 3.375 G IVPB 30 MIN: 3.3750 g | Freq: Once | INTRAVENOUS | Status: DC

## 2015-05-02 MED ORDER — SODIUM CHLORIDE 0.9 % IV BOLUS (SEPSIS)
1000.0000 mL | INTRAVENOUS | Status: AC
Start: 2015-05-02 — End: 2015-05-02
  Administered 2015-05-02: 1000 mL via INTRAVENOUS

## 2015-05-02 MED ORDER — CEFTRIAXONE SODIUM IN DEXTROSE 20 MG/ML IV SOLN
1.0000 g | INTRAVENOUS | Status: AC
  Administered 2015-05-02: 1 g via INTRAVENOUS

## 2015-05-02 MED ORDER — ACETAMINOPHEN 325 MG PO TABS
ORAL_TABLET | ORAL | Status: AC
Start: 2015-05-02 — End: 2015-05-02
  Administered 2015-05-02: 650 mg via ORAL
  Filled 2015-05-02: qty 2

## 2015-05-02 MED ORDER — SULFAMETHOXAZOLE-TRIMETHOPRIM 800-160 MG PO TABS
2.0000 | ORAL_TABLET | ORAL | Status: AC
  Administered 2015-05-02: 2 via ORAL

## 2015-05-02 MED ORDER — SULFAMETHOXAZOLE-TRIMETHOPRIM 800-160 MG PO TABS: 2.0000 | ORAL_TABLET | Freq: Two times a day (BID) | ORAL | Status: DC

## 2015-05-02 MED ORDER — SODIUM CHLORIDE 0.9 % IV BOLUS (SEPSIS): 1000.0000 mL | INTRAVENOUS | Status: DC

## 2015-05-02 MED ORDER — CEPHALEXIN 500 MG PO CAPS: 500.0000 mg | ORAL_CAPSULE | Freq: Four times a day (QID) | ORAL | Status: DC

## 2015-05-02 NOTE — ED Notes (Signed)
Wound to lower right leg X 1.5 week ago

## 2015-05-02 NOTE — ED Provider Notes (Signed)
[facility] Emergency Department Provider Note  ____________________________________________  Time seen: Approximately 6:12 PM  I have reviewed the triage vital signs and the nursing notes.   HISTORY  Chief Complaint Code Sepsis    HPI Ray Patel is a 64 y.o. male who sustained an injury to his right forehead and his right shin at work about 11 days ago.  He came to this emergency department and had both wounds sutured.  He has been fine since then until today when he felt like he had a fever, general malaise, and noticed some swelling and redness in the right shin around the wound.  He denies chest pain, shortness of breath, nausea, vomiting, abdominal pain.He did not receive any antibiotics after his injuries.   Past Medical History  Diagnosis Date  . Arthritis   . Hypertension   . Angioedema     Patient Active Problem List   Diagnosis Date Noted  . Essential hypertension, benign 11/05/2013  . High cholesterol 11/05/2013  . Chronic back pain 11/05/2013  . Angioedema 11/05/2013    Past Surgical History  Procedure Laterality Date  . Spine surgery      Current Outpatient Rx  Name  Route  Sig  Dispense  Refill  . amLODipine (NORVASC) 10 MG tablet   Oral   Take 1 tablet (10 mg total) by mouth daily.   30 tablet   11   . atorvastatin (LIPITOR) 40 MG tablet   Oral   Take 1 tablet (40 mg total) by mouth daily.   30 tablet   11   . EPINEPHrine 0.3 mg/0.3 mL IJ SOAJ injection   Intramuscular   Inject 0.3 mLs (0.3 mg total) into the muscle once. Patient taking differently: Inject 0.3 mg into the muscle once as needed (for anaphylaxis).    2 Device   2   . hydrochlorothiazide (HYDRODIURIL) 25 MG tablet   Oral   Take 0.5 tablets (12.5 mg total) by mouth daily.   90 tablet   3   . meloxicam (MOBIC) 15 MG tablet   Oral   Take 1 tablet (15 mg total) by mouth daily.   30 tablet   11   . albuterol (PROVENTIL HFA;VENTOLIN HFA) 108  (90 BASE) MCG/ACT inhaler   Inhalation   Inhale 2 puffs into the lungs every 4 (four) hours as needed for wheezing or shortness of breath (cough, shortness of breath or wheezing.). Patient not taking: Reported on 05/02/2015   1 Inhaler   1   . benzonatate (TESSALON PERLES) 100 MG capsule   Oral   Take 1 capsule (100 mg total) by mouth 3 (three) times daily as needed for cough. Patient not taking: Reported on 05/02/2015   20 capsule   0   .           .           . predniSONE (DELTASONE) 10 MG tablet   Oral   Take 2 tablets (20 mg total) by mouth 2 (two) times daily. Patient not taking: Reported on 05/02/2015   20 tablet   0   .             Allergies Delsym  Family History  Problem Relation Age of Onset  . Heart attack Mother   . COPD Brother     Social History History  Substance Use Topics  . Smoking status: Former Research scientist (life sciences)  . Smokeless tobacco: Not on file  . Alcohol Use: No  Review of Systems Constitutional: Subjective fever firmed in the emergency department at 102.  No malaise. Eyes: No visual changes. ENT: No sore throat. Cardiovascular: Denies chest pain. Respiratory: Denies shortness of breath. Gastrointestinal: No abdominal pain.  No nausea, no vomiting.  No diarrhea.  No constipation. Genitourinary: Negative for dysuria. Musculoskeletal: Negative for back pain. Skin: Redness and swelling at the site of the wound Neurological: Negative for headaches, focal weakness or numbness.  10-point ROS otherwise negative.  ____________________________________________   PHYSICAL EXAM:  VITAL SIGNS: ED Triage Vitals  Enc Vitals Group     BP 05/02/15 1805 158/74 mmHg     Pulse Rate 05/02/15 1805 120     Resp 05/02/15 1805 20     Temp 05/02/15 1805 102.1 F (38.9 C)     Temp Source 05/02/15 1805 Oral     SpO2 05/02/15 1805 97 %     Weight 05/02/15 1805 208 lb (94.348 kg)     Height 05/02/15 1805 5' 11" (1.803 m)     Head Cir --      Peak Flow --      Pain  Score 05/02/15 1806 8     Pain Loc --      Pain Edu? --      Excl. in [place]? --     Constitutional: Alert and oriented. Well appearing and in no acute distress. Eyes: Conjunctivae are normal. PERRL. EOMI. Head: Atraumatic. Nose: No congestion/rhinnorhea. Mouth/Throat: Mucous membranes are moist.  Oropharynx non-erythematous. Neck: No stridor.   Cardiovascular: Tachycardia, regular rhythm. Grossly normal heart sounds.  Good peripheral circulation. Respiratory: Normal respiratory effort.  No retractions. Lungs CTAB. Gastrointestinal: Soft and nontender. No distention. No abdominal bruits. No CVA tenderness. Musculoskeletal: No lower extremity tenderness nor edema.  No joint effusions. Neurologic:  Normal speech and language. No gross focal neurologic deficits are appreciated. Speech is normal. Skin:  Skin is warm, dry and intact.  Patient has what appears to be a healing laceration on his distal right shin consistent with a history.  There is a roughly circular area of erythema 6 cm in diameter surrounding the injury with no induration or fluctuance.  It feels warm and consistent with cellulitis.  It is not significantly tender to palpation.  A photo of this area appears under the Media tab within Chart Review.   Psychiatric:  Mood and affect are normal. Speech and behavior are normal.  ____________________________________________   LABS (all labs ordered are listed, but only abnormal results are displayed)  Labs Reviewed  COMPREHENSIVE METABOLIC PANEL - Abnormal; Notable for the following:    Glucose, Bld 106 (*)    BUN 25 (*)    Creatinine, Ser 1.30 (*)    GFR calc non Af Amer 56 (*)    All other components within normal limits  CBC WITH DIFFERENTIAL/PLATELET - Abnormal; Notable for the following:    WBC 17.6 (*)    Neutro Abs 14.8 (*)    Monocytes Absolute 1.1 (*)    All other components within normal limits  URINALYSIS COMPLETEWITH MICROSCOPIC ([facility] ONLY) - Abnormal; Notable for  the following:    Color, Urine YELLOW (*)    APPearance CLEAR (*)    All other components within normal limits  BASIC METABOLIC PANEL - Abnormal; Notable for the following:    Potassium 3.3 (*)    Glucose, Bld 113 (*)    BUN 24 (*)    Calcium 8.1 (*)    All other components within normal limits  CULTURE, BLOOD (ROUTINE X 2)  CULTURE, BLOOD (ROUTINE X 2)  LACTIC ACID, PLASMA  LACTIC ACID, PLASMA   ____________________________________________  EKG  ED ECG REPORT I, Shyrl Obi, the attending physician, personally viewed and interpreted this ECG.  Date: 05/02/2015 EKG Time: 18:50 Rate: 107 Rhythm: Mild sinus tachycardia QRS Axis: normal Intervals: normal ST/T Wave abnormalities: normal Conduction Disutrbances: none Narrative Interpretation: unremarkable  ____________________________________________  RADIOLOGY  I, Klynn Linnemann, personally viewed and evaluated these images as part of my medical decision making.   Dg Chest Port 1 View  05/02/2015   CLINICAL DATA:  Fever, chills, swelling in right leg today. Fall 11 days ago. Sepsis.  EXAM: PORTABLE CHEST - 1 VIEW  COMPARISON:  11/25/2014  FINDINGS: The heart size and mediastinal contours are within normal limits. Both lungs are clear. The visualized skeletal structures are unremarkable.  IMPRESSION: No active disease.   Electronically Signed   By: Rolm Baptise M.D.   On: 05/02/2015 19:05    ____________________________________________   PROCEDURES  Procedure(s) performed: bedside ultrasound, see procedure note(s).  EMERGENCY DEPARTMENT US SOFT TISSUE INTERPRETATION "Study: Limited Soft Tissue Ultrasound"  INDICATIONS: Soft tissue infection Multiple views of the body part were obtained in real-time with a multi-frequency linear probe PERFORMED BY:  Myself IMAGES ARCHIVED?: No SIDE:Right  BODY PART:Lower extremity FINDINGS: No abcess noted and Cellulitis present INTERPRETATION:  Cellulitis present  Critical Care  performed: Yes, see critical care note(s)   CRITICAL CARE Performed by: Hinda Kehr   Total critical care time: 30 minutes  Critical care time was exclusive of separately billable procedures and treating other patients.  Critical care was necessary to treat or prevent imminent or life-threatening deterioration.  Critical care was time spent personally by me on the following activities: development of treatment plan with patient and/or surrogate as well as nursing, discussions with consultants, evaluation of patient's response to treatment, examination of patient, obtaining history from patient or surrogate, ordering and performing treatments and interventions, ordering and review of laboratory studies, ordering and review of radiographic studies, pulse oximetry and re-evaluation of patient's condition.  ____________________________________________   INITIAL IMPRESSION / ASSESSMENT AND PLAN / ED COURSE  Pertinent labs & imaging results that were available during my care of the patient were reviewed by me and considered in my medical decision making (see chart for details). the patient met sepsis criteria in triage and a code sepsis was called.  I immediately met the patient in the room as he arrived and evaluated him.  We pursued the sepsis protocol, but the patient is very well-appearing and in no acute distress with a stable blood pressure.  He has mild tachycardia and an obvious source of infection, but the area is quite small.  I reviewed the prior ED provider note which confirmed the use of absorbable sutures.  I will treat the patient with ceftriaxone 1 g IV and Bactrim DS 2 tablets by mouth and provided 1 L of normal saline and then reassess.  At this point though he meet sepsis criteria I do not feel that he requires inpatient admission.  ----------------------------------------- 9:48 PM on 05/02/2015 -----------------------------------------  I reassessed the patient after 2 L of  fluid and after repeating his BMP and his lactic acid.  Both his creatinine and his lactate has come down and he states he feels fine and wants to go home.  I trust that he will come back if his infection gets worse.  I gave him my usual and customary return  precautions and follow-up recommendations.  As documented above on the procedure note, there is no abscess or fluid collection present on bedside ultrasound.   ________________________________________  FINAL CLINICAL IMPRESSION(S) / ED DIAGNOSES  Final diagnoses:  Cellulitis of right anterior lower leg      NEW MEDICATIONS STARTED DURING THIS VISIT:  New Prescriptions   CEPHALEXIN (KEFLEX) 500 MG CAPSULE    Take 1 capsule (500 mg total) by mouth 4 (four) times daily.   SULFAMETHOXAZOLE-TRIMETHOPRIM (BACTRIM DS,SEPTRA DS) 800-160 MG PER TABLET    Take 2 tablets by mouth 2 (two) times daily.     Hinda Kehr, MD 05/02/15 2204

## 2015-05-02 NOTE — ED Notes (Signed)
Pt states that he feels feverish, began today. Denies cough, congestion, abdominal pain, SOB, CP..Marland Kitchen

## 2015-05-02 NOTE — Discharge Instructions (Signed)
You have been seen today in the Emergency Department (ED) for cellulitis, a superficial skin infection. Please take your antibiotics as prescribed for their ENTIRE prescribed duration.  Take Tylenol or Motrin as needed for pain and fever, but only as written on the box.   Please follow up with your doctor or in the ED in 24-48 hours for recheck of your infection if you are not improving.  Call your doctor sooner or return to the ED if you develop worsening signs of infection such as: increased redness, increased pain, pus, fever, or other symptoms that concern you.   Cellulitis Cellulitis is an infection of the skin and the tissue beneath it. The infected area is usually red and tender. Cellulitis occurs most often in the arms and lower legs.  CAUSES  Cellulitis is caused by bacteria that enter the skin through cracks or cuts in the skin. The most common types of bacteria that cause cellulitis are staphylococci and streptococci. SIGNS AND SYMPTOMS   Redness and warmth.  Swelling.  Tenderness or pain.  Fever. DIAGNOSIS  Your health care provider can usually determine what is wrong based on a physical exam. Blood tests may also be done. TREATMENT  Treatment usually involves taking an antibiotic medicine. HOME CARE INSTRUCTIONS   Take your antibiotic medicine as directed by your health care provider. Finish the antibiotic even if you start to feel better.  Keep the infected arm or leg elevated to reduce swelling.  Apply a warm cloth to the affected area up to 4 times per day to relieve pain.  Take medicines only as directed by your health care provider.  Keep all follow-up visits as directed by your health care provider. SEEK MEDICAL CARE IF:   You notice red streaks coming from the infected area.  Your red area gets larger or turns dark in color.  Your bone or joint underneath the infected area becomes painful after the skin has healed.  Your infection returns in the same area  or another area.  You notice a swollen bump in the infected area.  You develop new symptoms.  You have a fever. SEEK IMMEDIATE MEDICAL CARE IF:   You feel very sleepy.  You develop vomiting or diarrhea.  You have a general ill feeling (malaise) with muscle aches and pains. MAKE SURE YOU:   Understand these instructions.  Will watch your condition.  Will get help right away if you are not doing well or get worse. Document Released: 07/28/2005 Document Revised: 03/04/2014 Document Reviewed: 01/03/2012 [employer] Patient Information 2015 [place], [place]. This information is not intended to replace advice given to you by your health care provider. Make sure you discuss any questions you have with your health care provider.

## 2015-05-02 NOTE — ED Notes (Signed)
Pt states that he fell a week ago Monday at work and was seen and treated for worker's comp with stiches placed, pt states that this am he started to feel bad, looked at his rt leg and noticed redness with swelling, pt states that he feels feverish

## 2015-05-02 NOTE — ED Notes (Signed)
MD at bedside. 

## 2015-05-08 ENCOUNTER — Encounter (HOSPITAL_COMMUNITY): Payer: Self-pay | Admitting: Emergency Medicine

## 2015-05-08 ENCOUNTER — Inpatient Hospital Stay (HOSPITAL_COMMUNITY)
Admission: EM | Admit: 2015-05-08 | Discharge: 2015-05-11 | DRG: 603 | Disposition: A | Discharge status: Home / Self Care | Payer: Worker's Compensation | Attending: Family Medicine | Admitting: Family Medicine

## 2015-05-08 DIAGNOSIS — S81811S Laceration without foreign body, right lower leg, sequela: Secondary | ICD-10-CM | POA: Diagnosis not present

## 2015-05-08 DIAGNOSIS — Z881 Allergy status to other antibiotic agents status: Secondary | ICD-10-CM | POA: Diagnosis not present

## 2015-05-08 DIAGNOSIS — M549 Dorsalgia, unspecified: Secondary | ICD-10-CM | POA: Diagnosis present

## 2015-05-08 DIAGNOSIS — Z6828 Body mass index (BMI) 28.0-28.9, adult: Secondary | ICD-10-CM

## 2015-05-08 DIAGNOSIS — L02419 Cutaneous abscess of limb, unspecified: Secondary | ICD-10-CM | POA: Diagnosis present

## 2015-05-08 DIAGNOSIS — L03115 Cellulitis of right lower limb: Principal | ICD-10-CM | POA: Insufficient documentation

## 2015-05-08 DIAGNOSIS — M1990 Unspecified osteoarthritis, unspecified site: Secondary | ICD-10-CM | POA: Diagnosis present

## 2015-05-08 DIAGNOSIS — E669 Obesity, unspecified: Secondary | ICD-10-CM | POA: Diagnosis present

## 2015-05-08 DIAGNOSIS — M79604 Pain in right leg: Secondary | ICD-10-CM | POA: Diagnosis not present

## 2015-05-08 DIAGNOSIS — G8929 Other chronic pain: Secondary | ICD-10-CM | POA: Diagnosis present

## 2015-05-08 DIAGNOSIS — N179 Acute kidney failure, unspecified: Secondary | ICD-10-CM | POA: Insufficient documentation

## 2015-05-08 DIAGNOSIS — L03119 Cellulitis of unspecified part of limb: Secondary | ICD-10-CM | POA: Diagnosis not present

## 2015-05-08 DIAGNOSIS — L0390 Cellulitis, unspecified: Secondary | ICD-10-CM | POA: Diagnosis present

## 2015-05-08 DIAGNOSIS — E785 Hyperlipidemia, unspecified: Secondary | ICD-10-CM | POA: Diagnosis present

## 2015-05-08 DIAGNOSIS — I10 Essential (primary) hypertension: Secondary | ICD-10-CM | POA: Insufficient documentation

## 2015-05-08 DIAGNOSIS — E860 Dehydration: Secondary | ICD-10-CM | POA: Diagnosis present

## 2015-05-08 DIAGNOSIS — S81819A Laceration without foreign body, unspecified lower leg, initial encounter: Secondary | ICD-10-CM | POA: Insufficient documentation

## 2015-05-08 DIAGNOSIS — Z87891 Personal history of nicotine dependence: Secondary | ICD-10-CM

## 2015-05-08 HISTORY — DX: Gastro-esophageal reflux disease without esophagitis: K21.9

## 2015-05-08 HISTORY — DX: Calculus of kidney: N20.0

## 2015-05-08 HISTORY — DX: Pure hypercholesterolemia, unspecified: E78.00

## 2015-05-08 HISTORY — DX: Cellulitis of right lower limb: L03.115

## 2015-05-08 LAB — BASIC METABOLIC PANEL
ANION GAP: 9 (ref 5–15)
BUN: 23 mg/dL — AB (ref 6–20)
CO2: 24 mmol/L (ref 22–32)
CREATININE: 1.46 mg/dL — AB (ref 0.61–1.24)
Calcium: 8.5 mg/dL — ABNORMAL LOW (ref 8.9–10.3)
Chloride: 101 mmol/L (ref 101–111)
GFR calc non Af Amer: 49 mL/min — ABNORMAL LOW (ref >60)
GFR, EST AFRICAN AMERICAN: 56 mL/min — AB (ref >60)
Glucose, Bld: 132 mg/dL — ABNORMAL HIGH (ref 65–99)
POTASSIUM: 3.9 mmol/L (ref 3.5–5.1)
Sodium: 134 mmol/L — ABNORMAL LOW (ref 135–145)

## 2015-05-08 LAB — CBC WITH DIFFERENTIAL/PLATELET
Basophils Absolute: 0 10*3/uL (ref 0.0–0.1)
Basophils Relative: 0 % (ref 0–1)
Eosinophils Absolute: 0.2 10*3/uL (ref 0.0–0.7)
Eosinophils Relative: 3 % (ref 0–5)
HEMATOCRIT: 38.1 % — AB (ref 39.0–52.0)
HEMOGLOBIN: 13.3 g/dL (ref 13.0–17.0)
LYMPHS PCT: 12 % (ref 12–46)
Lymphs Abs: 1 10*3/uL (ref 0.7–4.0)
MCH: 29.8 pg (ref 26.0–34.0)
MCHC: 34.9 g/dL (ref 30.0–36.0)
MCV: 85.2 fL (ref 78.0–100.0)
MONOS PCT: 11 % (ref 3–12)
Monocytes Absolute: 0.9 10*3/uL (ref 0.1–1.0)
NEUTROS ABS: 6.3 10*3/uL (ref 1.7–7.7)
Neutrophils Relative %: 74 % (ref 43–77)
Platelets: 255 10*3/uL (ref 150–400)
RBC: 4.47 MIL/uL (ref 4.22–5.81)
RDW: 13.1 % (ref 11.5–15.5)
WBC: 8.4 10*3/uL (ref 4.0–10.5)

## 2015-05-08 LAB — CULTURE, BLOOD (ROUTINE X 2)
Culture: NO GROWTH
Culture: NO GROWTH

## 2015-05-08 LAB — I-STAT CG4 LACTIC ACID, ED: Lactic Acid, Venous: 1.46 mmol/L (ref 0.5–2.0)

## 2015-05-08 LAB — CREATININE, URINE, RANDOM: Creatinine, Urine: 57.89 mg/dL

## 2015-05-08 LAB — SODIUM, URINE, RANDOM: Sodium, Ur: 107 mmol/L

## 2015-05-08 MED ORDER — VANCOMYCIN HCL IN DEXTROSE 1-5 GM/200ML-% IV SOLN
1000.0000 mg | Freq: Two times a day (BID) | INTRAVENOUS | Status: DC
  Administered 2015-05-08 – 2015-05-10 (×4): 1000 mg via INTRAVENOUS
  Filled 2015-05-08 (×5): qty 200

## 2015-05-08 MED ORDER — ENSURE ENLIVE PO LIQD
237.0000 mL | Freq: Two times a day (BID) | ORAL | Status: DC
  Administered 2015-05-08 – 2015-05-11 (×5): 237 mL via ORAL

## 2015-05-08 MED ORDER — HYDROCHLOROTHIAZIDE 25 MG PO TABS: 12.5000 mg | ORAL_TABLET | Freq: Every day | ORAL | Status: DC

## 2015-05-08 MED ORDER — HYDROCHLOROTHIAZIDE 12.5 MG PO CAPS
12.5000 mg | ORAL_CAPSULE | Freq: Every day | ORAL | Status: DC
  Administered 2015-05-09 – 2015-05-11 (×3): 12.5 mg via ORAL
  Filled 2015-05-08 (×3): qty 1

## 2015-05-08 MED ORDER — POLYETHYLENE GLYCOL 3350 17 G PO PACK: 17.0000 g | PACK | Freq: Every day | ORAL | Status: DC | PRN

## 2015-05-08 MED ORDER — ACETAMINOPHEN 650 MG RE SUPP: 650.0000 mg | Freq: Four times a day (QID) | RECTAL | Status: DC | PRN

## 2015-05-08 MED ORDER — SODIUM CHLORIDE 0.9 % IV BOLUS (SEPSIS)
1000.0000 mL | Freq: Once | INTRAVENOUS | Status: AC
Start: 2015-05-08 — End: 2015-05-08
  Administered 2015-05-08: 1000 mL via INTRAVENOUS

## 2015-05-08 MED ORDER — SODIUM CHLORIDE 0.9 % IJ SOLN: 3.0000 mL | INTRAMUSCULAR | Status: DC | PRN

## 2015-05-08 MED ORDER — OXYCODONE HCL 5 MG PO TABS: 5.0000 mg | ORAL_TABLET | ORAL | Status: DC | PRN

## 2015-05-08 MED ORDER — ALBUTEROL SULFATE HFA 108 (90 BASE) MCG/ACT IN AERS: 1.0000 | INHALATION_SPRAY | RESPIRATORY_TRACT | Status: DC | PRN

## 2015-05-08 MED ORDER — AMLODIPINE BESYLATE 10 MG PO TABS
10.0000 mg | ORAL_TABLET | Freq: Every day | ORAL | Status: DC
  Administered 2015-05-09 – 2015-05-11 (×3): 10 mg via ORAL
  Filled 2015-05-08 (×3): qty 1

## 2015-05-08 MED ORDER — ATORVASTATIN CALCIUM 40 MG PO TABS
40.0000 mg | ORAL_TABLET | Freq: Every day | ORAL | Status: DC
  Administered 2015-05-08 – 2015-05-10 (×3): 40 mg via ORAL
  Filled 2015-05-08 (×3): qty 1

## 2015-05-08 MED ORDER — SODIUM CHLORIDE 0.9 % IV SOLN
Freq: Once | INTRAVENOUS | Status: AC
  Administered 2015-05-08: 10:00:00 via INTRAVENOUS

## 2015-05-08 MED ORDER — ENOXAPARIN SODIUM 40 MG/0.4ML ~~LOC~~ SOLN
40.0000 mg | SUBCUTANEOUS | Status: DC
  Administered 2015-05-08 – 2015-05-10 (×3): 40 mg via SUBCUTANEOUS
  Filled 2015-05-08 (×3): qty 0.4

## 2015-05-08 MED ORDER — SODIUM CHLORIDE 0.9 % IV SOLN: 250.0000 mL | INTRAVENOUS | Status: DC | PRN

## 2015-05-08 MED ORDER — MELOXICAM 7.5 MG PO TABS
15.0000 mg | ORAL_TABLET | Freq: Every day | ORAL | Status: DC
  Administered 2015-05-09 – 2015-05-10 (×2): 15 mg via ORAL
  Filled 2015-05-08 (×2): qty 2

## 2015-05-08 MED ORDER — SODIUM CHLORIDE 0.9 % IJ SOLN
3.0000 mL | Freq: Two times a day (BID) | INTRAMUSCULAR | Status: DC
  Administered 2015-05-09 – 2015-05-11 (×3): 3 mL via INTRAVENOUS

## 2015-05-08 MED ORDER — ACETAMINOPHEN 325 MG PO TABS: 650.0000 mg | ORAL_TABLET | Freq: Four times a day (QID) | ORAL | Status: DC | PRN

## 2015-05-08 MED ORDER — VANCOMYCIN HCL IN DEXTROSE 1-5 GM/200ML-% IV SOLN
1000.0000 mg | Freq: Once | INTRAVENOUS | Status: AC
  Administered 2015-05-08: 1000 mg via INTRAVENOUS
  Filled 2015-05-08: qty 200

## 2015-05-08 NOTE — ED Provider Notes (Signed)
CSN: [id number]     Arrival date & time 05/08/15  0854 History   First MD Initiated Contact with Patient 05/08/15 [phone]     Chief Complaint  Patient presents with  . Leg Problem    right     (Consider location/radiation/quality/duration/timing/severity/associated sxs/prior Treatment) HPI Patient presents with concern of increasing pain, discomfort about a wound on his right calf. Almost 3 weeks ago the patient had a fall at work. He suffered a laceration on the anterior aspect of the calf. The wound has been cleaned, sutured, but the patient subsequently developed erythema about the area, in one week ago started on antibiotics for cellulitis. He states that over the last 2 days in particular the erythema has increased in size, the warmth has increased, and there is increasing pain about the area. There is no associated distal loss of sensation, or strength. The pain is worse with ambulation or pressure. Pain is minimally better at rest. He also complains of subjective fever, though no objective fever at home in the past 24 hours. Patient was prescribed Bactrim, Keflex. He has continued to take Bactrim, though stopped Keflex secondary to hives   Past Medical History  Diagnosis Date  . Arthritis   . Hypertension   . Angioedema    Past Surgical History  Procedure Laterality Date  . Spine surgery     Family History  Problem Relation Age of Onset  . Heart attack Mother   . COPD Brother    History  Substance Use Topics  . Smoking status: Former Games developermoker  . Smokeless tobacco: Not on file  . Alcohol Use: No    Review of Systems  Constitutional:       Per HPI, otherwise negative  HENT:       Per HPI, otherwise negative  Respiratory:       Per HPI, otherwise negative  Cardiovascular:       Per HPI, otherwise negative  Gastrointestinal: Negative for vomiting.  Endocrine:       Negative aside from HPI  Genitourinary:       Neg aside from HPI   Musculoskeletal:       Per  HPI, otherwise negative  Skin: Positive for color change and wound.  Neurological: Negative for syncope.      Allergies  Delsym and Keflex  Home Medications   Prior to Admission medications   Medication Sig Start Date End Date Taking? Authorizing Provider  amLODipine (NORVASC) 10 MG tablet Take 1 tablet (10 mg total) by mouth daily. 11/25/14   Raelyn Ensignodd McVeigh, PA  atorvastatin (LIPITOR) 40 MG tablet Take 1 tablet (40 mg total) by mouth daily. 11/25/14   Raelyn Ensignodd McVeigh, PA  cephALEXin (KEFLEX) 500 MG capsule Take 1 capsule (500 mg total) by mouth 4 (four) times daily. 05/02/15   Loleta Roseory Forbach, MD  EPINEPHrine 0.3 mg/0.3 mL IJ SOAJ injection Inject 0.3 mLs (0.3 mg total) into the muscle once. Patient taking differently: Inject 0.3 mg into the muscle once as needed (for anaphylaxis).  11/25/14   Raelyn Ensignodd McVeigh, PA  hydrochlorothiazide (HYDRODIURIL) 25 MG tablet Take 0.5 tablets (12.5 mg total) by mouth daily. 11/25/14   Raelyn Ensignodd McVeigh, PA  meloxicam (MOBIC) 15 MG tablet Take 1 tablet (15 mg total) by mouth daily. 11/25/14   Raelyn Ensignodd McVeigh, PA  sulfamethoxazole-trimethoprim (BACTRIM DS,SEPTRA DS) 800-160 MG per tablet Take 2 tablets by mouth 2 (two) times daily. 05/02/15   Loleta Roseory Forbach, MD   BP 143/63 mmHg  Pulse 83  Temp(Src)  98.2 F (36.8 C) (Oral)  Resp 18  Ht  (1.803 m)  Wt 207 lb (93.895 kg)  BMI 28.88 kg/m2  SpO2 100% Physical Exam  Constitutional: He is oriented to person, place, and time. He appears well-developed. No distress.  HENT:  Head: Normocephalic and atraumatic.  Eyes: Conjunctivae and EOM are normal.  Cardiovascular: Normal rate, regular rhythm and intact distal pulses.   Pulmonary/Chest: Effort normal. No stridor. No respiratory distress.  Abdominal: He exhibits no distension.  Musculoskeletal: He exhibits no edema.  Neurological: He is alert and oriented to person, place, and time.  No distal neurovascular compromise  Skin: Skin is warm and dry.     Psychiatric: He has  a normal mood and affect.  Nursing note and vitals reviewed.   ED Course  Procedures (including critical care time) I saw the relevant labs and studies - I agree with the interpretation.   I reviewed the results (including imaging as performed), agree with the interpretation  On repeat exam the patient appears better.  We reviewed all findings.   MDM  Patient presents with worsening right lower extremity wound concerning for cellulitis, refractory to outpatient antibiotics. No evidence for bacteremia or sepsis. Patient admitted for further evaluation and management after initiation of fluids, after initiation of vancomycin as well.  Gerhard Munch, MD 05/08/15 1249

## 2015-05-08 NOTE — H&P (Signed)
Family Medicine Teaching [facility] Admission History and Physical Service Pager: [phone]  Patient name: Ray Patel Medical record number: [id number] Date of birth: 1951-01-19 Age: 64 y.o. Gender: male  Primary Care Provider: Abbe Amsterdam, MD Consultants: None Code Status: FULL  Chief Complaint: R leg pain/redness  Assessment and Plan: Ray Patel is a 64 y.o. male presenting with pain and redness in his R leg . PMH is significant for HTN and HLD.  Cellulitis of RLE: diagnosed with cellulitis on July 1 after cutting his leg at work. Soft tissue US at ED at the time showed no abscess but cellulitis present. Finished six days of Bactrim without improvement of symptoms. Afebrile, LA and WBC WNL on admission. - Admitted to floor under attending Dr. Gwendolyn Grant - Start IV vanc - Tylenol PRN pain or fever  - Oxycodone PRN breakthrough pain  HTN  - Continue home Norvasc, HCTZ  HLD - Continue home Lipitor  FEN/GI: Ensure Enlive, Miralax Prophylaxis: Lovenox  Disposition: admit to floor   History of Present Illness: Ray Patel is a 64 y.o. male presenting with pain and redness of his R shin.   Mr. Rylander reports that he fell at work on the 06/20. He cut his R shin on a metal dock board, and subsequently hit his head on a metal building support beam while falling. He received a tetanus shot afterwards. He first presented to the ED at [facility] on 07/01 after his injury became more painful and the area started turning red. The laceration was cleaned and sutured, and he was sent home with Keflex and Bactrim. That night, prior to taking antibiotics, he had a fever of 102F at home. After taking the Keflex, he experienced hives, so he stopped taking it after one day. He has completed one week of Bactrim.   Despite the antibiotics, he reports that the site of the laceration continued to become more swollen, redder, and larger. He says there has been a little bit of  draining from the central lesion.  He reports pain when walking. He said he had a fever of 100F two nights ago. He has felt queasy, but has had no vomiting. He denies chills or HA.    Review Of Systems: Per HPI with the following additions: denies N/V/C/D, chills, HA Otherwise 12 point review of systems was performed and was unremarkable.  Patient Active Problem List   Diagnosis Date Noted  . Cellulitis 05/08/2015  . Essential hypertension, benign 11/05/2013  . High cholesterol 11/05/2013  . Chronic back pain 11/05/2013  . Angioedema 11/05/2013   Past Medical History: Past Medical History  Diagnosis Date  . Arthritis   . Hypertension   . Angioedema    Past Surgical History: Past Surgical History  Procedure Laterality Date  . Spine surgery     Social History: History  Substance Use Topics  . Smoking status: Former Games developer  . Smokeless tobacco: Not on file  . Alcohol Use: No   Please also refer to relevant sections of EMR.  Family History: Family History  Problem Relation Age of Onset  . Heart attack Mother   . COPD Brother    Allergies and Medications: Allergies  Allergen Reactions  . Delsym [Dextromethorphan] Anaphylaxis  . Keflex [Cephalexin] Swelling   Current Facility-Administered Medications on File Prior to Encounter  Medication Dose Route Frequency Provider Last Rate Last Dose  . albuterol (PROVENTIL) (2.5 MG/3ML) 0.083% nebulizer solution 2.5 mg  2.5 mg Nebulization Once [employer], PA      .  ipratropium (ATROVENT) nebulizer solution 0.5 mg  0.5 mg Nebulization Once [employer] McVeigh, PA      . ipratropium (ATROVENT) nebulizer solution 0.5 mg  0.5 mg Nebulization Once Raelyn Ensignodd McVeigh, PA       Current Outpatient Prescriptions on File Prior to Encounter  Medication Sig Dispense Refill  . amLODipine (NORVASC) 10 MG tablet Take 1 tablet (10 mg total) by mouth daily. 30 tablet 11  . atorvastatin (LIPITOR) 40 MG tablet Take 1 tablet (40 mg total) by mouth daily. 30  tablet 11  . EPINEPHrine 0.3 mg/0.3 mL IJ SOAJ injection Inject 0.3 mLs (0.3 mg total) into the muscle once. (Patient taking differently: Inject 0.3 mg into the muscle once as needed (for anaphylaxis). ) 2 Device 2  . meloxicam (MOBIC) 15 MG tablet Take 1 tablet (15 mg total) by mouth daily. 30 tablet 11  . sulfamethoxazole-trimethoprim (BACTRIM DS,SEPTRA DS) 800-160 MG per tablet Take 2 tablets by mouth 2 (two) times daily. 40 tablet 0  . cephALEXin (KEFLEX) 500 MG capsule Take 1 capsule (500 mg total) by mouth 4 (four) times daily. (Patient not taking: Reported on 05/08/2015) 40 capsule 0  . hydrochlorothiazide (HYDRODIURIL) 25 MG tablet Take 0.5 tablets (12.5 mg total) by mouth daily. (Patient not taking: Reported on 05/08/2015) 90 tablet 3    Objective: BP 121/55 mmHg  Pulse 72  Temp(Src) 98.2 F (36.8 C) (Oral)  Resp 16  Ht 5\' 11"  (1.803 m)  Wt 207 lb (93.895 kg)  BMI 28.88 kg/m2  SpO2 96% Exam: General: pleasant well-appearing obese male lying in bed Eyes: PERRLA ENTM: MMM Cardiovascular: RRR, no murmurs appreciated Respiratory: CTAB, no wheezes Abdomen: soft, non-tender, non-distended, +BS MSK: pain upon palpation of R shin Skin: 1 ft x 6 in warm erythematous area on R shin with central lesion containing sutures and minimal purulence, no fluctuance or active drainage, perimeter outlined with Sharpie Neuro: no gross neuro deficits, A&Ox3 Psych: normal mood and affect  Labs and Imaging: CBC BMET   Recent Labs Lab 05/08/15 0950  WBC 8.4  HGB 13.3  HCT 38.1*  PLT 255    Recent Labs Lab 05/08/15 0950  NA 134*  K 3.9  CL 101  CO2 24  BUN 23*  CREATININE 1.46*  GLUCOSE 132*  CALCIUM 8.5*     No results found.   Marquette SaaAbigail Joseph Anita Laguna, MD 05/08/2015, 2:21 PM PGY-1, [facility] FPTS Intern pager: [phone], text pages welcome

## 2015-05-08 NOTE — ED Notes (Signed)
Pt had injury to right leg at work June 20th from fall. Pt noted to have swelling and redness and warm to touch to lower right leg. Pt is currently taking Bactrim. Pt sts the leg is getting worse. Denies and drainage. Pt took Advil around 8p for fever. Pt sts he did not take his temperature but felt like he had a fever. Pt sts he was taking Keflex for the cellulitis and had an allergic reaction with face/mouth swelling and pt took Benadryl to relieve reaction. Pt noted to have one whelp to  R arm from reaction.

## 2015-05-08 NOTE — ED Notes (Signed)
Attempted to call report

## 2015-05-08 NOTE — Progress Notes (Signed)
ANTIBIOTIC CONSULT NOTE - INITIAL  Pharmacy Consult for Vancomycin Indication: Cellulitis of calf  Allergies  Allergen Reactions  . Delsym [Dextromethorphan] Anaphylaxis  . Keflex [Cephalexin] Swelling    Patient Measurements: Height: 5\' 11"  (180.3 cm) Weight: 207 lb (93.895 kg) IBW/kg (Calculated) : 75.3 Adjusted Body Weight:   Vital Signs: Temp: 98.2 F (36.8 C) (07/07 1324) Temp Source: Oral (07/07 1324) BP: 136/58 mmHg (07/07 1445) Pulse Rate: 70 (07/07 1445) Intake/Output from previous day:   Intake/Output from this shift:    Labs:  Recent Labs  05/08/15 0950  WBC 8.4  HGB 13.3  PLT 255  CREATININE 1.46*   Estimated Creatinine Clearance: 59 mL/min (by C-G formula based on Cr of 1.46). No results for input(s): VANCOTROUGH, VANCOPEAK, VANCORANDOM, GENTTROUGH, GENTPEAK, GENTRANDOM, TOBRATROUGH, TOBRAPEAK, TOBRARND, AMIKACINPEAK, AMIKACINTROU, AMIKACIN in the last 72 hours.   Microbiology: Recent Results (from the past 720 hour(s))  Blood Culture (routine x 2)     Status: None   Collection Time: 05/02/15  6:17 PM  Result Value Ref Range Status   Specimen Description BLOOD  Final   Special Requests NONE  Final   Culture NO GROWTH 6 DAYS  Final   Report Status 05/08/2015 FINAL  Final  Blood Culture (routine x 2)     Status: None   Collection Time: 05/02/15  6:24 PM  Result Value Ref Range Status   Specimen Description BLOOD  Final   Special Requests NONE  Final   Culture NO GROWTH 6 DAYS  Final   Report Status 05/08/2015 FINAL  Final    Medical History: Past Medical History  Diagnosis Date  . Arthritis   . Hypertension   . Angioedema     Medications:  Scheduled:  . [START ON 05/09/2015] amLODipine  10 mg Oral Daily  . atorvastatin  40 mg Oral q1800  . enoxaparin (LOVENOX) injection  40 mg Subcutaneous Q24H  . [START ON 05/09/2015] hydrochlorothiazide  12.5 mg Oral Daily  . [START ON 05/09/2015] meloxicam  15 mg Oral Q breakfast  . sodium chloride   1,000 mL Intravenous Once  . sodium chloride  3 mL Intravenous Q12H  . vancomycin  1,000 mg Intravenous Q12H   Assessment: 64 yr old male suffered a laceration to his leg while at work. The leg was sutured and the pt was started on Bactrim and Keflex. The wound became more painful and erythema in creased. Pt had stopped the Keflex due to hives. The pt now has a fever and is being treated for cellulitis with vancomycin, pharmacy to dose. Pt got 1 Gm of Vancomycin at 10 AM.  Goal of Therapy:  Vanc trough 10-15  Plan:  Pt got 1 Gm Vancomycin at 10 AM. Will order 1 Gm bid. Will check vanc trough when at steady state.  Eugene Garnetotter, Josphine Laffey Sue 05/08/2015,4:07 PM

## 2015-05-09 DIAGNOSIS — L02419 Cutaneous abscess of limb, unspecified: Secondary | ICD-10-CM

## 2015-05-09 DIAGNOSIS — L03119 Cellulitis of unspecified part of limb: Secondary | ICD-10-CM

## 2015-05-09 LAB — BASIC METABOLIC PANEL
Anion gap: 8 (ref 5–15)
BUN: 15 mg/dL (ref 6–20)
CO2: 24 mmol/L (ref 22–32)
Calcium: 7.9 mg/dL — ABNORMAL LOW (ref 8.9–10.3)
Chloride: 104 mmol/L (ref 101–111)
Creatinine, Ser: 1.12 mg/dL (ref 0.61–1.24)
GFR calc non Af Amer: >60 mL/min (ref >60)
Glucose, Bld: 93 mg/dL (ref 65–99)
POTASSIUM: 4.1 mmol/L (ref 3.5–5.1)
SODIUM: 136 mmol/L (ref 135–145)

## 2015-05-09 LAB — CBC
HEMATOCRIT: 37.3 % — AB (ref 39.0–52.0)
HEMOGLOBIN: 12.8 g/dL — AB (ref 13.0–17.0)
MCH: 29.6 pg (ref 26.0–34.0)
MCHC: 34.3 g/dL (ref 30.0–36.0)
MCV: 86.3 fL (ref 78.0–100.0)
Platelets: 267 10*3/uL (ref 150–400)
RBC: 4.32 MIL/uL (ref 4.22–5.81)
RDW: 13.1 % (ref 11.5–15.5)
WBC: 8 10*3/uL (ref 4.0–10.5)

## 2015-05-09 LAB — HIV ANTIBODY (ROUTINE TESTING W REFLEX): HIV SCREEN 4TH GENERATION: NONREACTIVE

## 2015-05-09 MED ORDER — DIPHENHYDRAMINE HCL 25 MG PO CAPS
25.0000 mg | ORAL_CAPSULE | Freq: Four times a day (QID) | ORAL | Status: DC | PRN
  Administered 2015-05-09: 25 mg via ORAL
  Filled 2015-05-09: qty 1

## 2015-05-09 MED ORDER — FAMOTIDINE 20 MG PO TABS
20.0000 mg | ORAL_TABLET | Freq: Every day | ORAL | Status: DC
  Administered 2015-05-09 – 2015-05-11 (×3): 20 mg via ORAL
  Filled 2015-05-09 (×3): qty 1

## 2015-05-09 NOTE — Progress Notes (Signed)
Notified provider on call that patient is having some swelling in upper extremities and states that he has heartburn after breakfast. Notified provider on-call and orders were received via phone. Will continue to monitor.

## 2015-05-09 NOTE — Progress Notes (Signed)
Nutrition Brief Note  Patient identified on the Malnutrition Screening Tool (MST) Report  Wt Readings from Last 15 Encounters:  05/08/15 200 lb (90.719 kg)  05/02/15 208 lb (94.348 kg)  04/21/15 218 lb (98.884 kg)  11/25/14 197 lb (89.359 kg)  11/23/14 205 lb (92.987 kg)  11/05/13 199 lb 6.4 oz (90.447 kg)   64 yo M with cellulitis of Right shin for past week or so. Cut leg on metal docking board at work. Fell and hit head. No LOC. Presented at [facility], had stitches and started on Keflex and Bactrim. Had tongue and mouth swelling about Keflex. Has stopped this and been taking bactrim. Redness, pain, and swelling has continued. [facility] and admitted to FPTS.  Spoke with pt at bedside, who denies weight loss. Reports UBW around 200#. He reports his appetite was poor 2-3 days PTA due to feeling queasy, but has a great appetite at baseline. He consumed 100% of his breakfast this morning.   Body mass index is 27.91 kg/(m^2). Patient meets criteria for overweight based on current BMI.   Current diet order is Heart Healthy, patient is consuming approximately 100% of meals at this time. Labs and medications reviewed.   No nutrition interventions warranted at this time. If nutrition issues arise, please consult RD.   Ihor Meinzer A. Mayford KnifeWilliams, RD, LDN, CDE Pager: [phone] After hours Pager: [phone]

## 2015-05-09 NOTE — Progress Notes (Signed)
FMTS Attending Daily Note:  S:  Feeling much better today.  Leg has less pain.  No longer nauseous.    Exam:  Pt lying in bed, NAD.  ABdomen NT.  RLE with erythema which has retreated from borders of marker.  Still with central eschar.  No fluctuance or induration.  Still with heat.  Also +3 pitting edema at ankles -- but better than yesterday.    Imp/Plan: 1. Cellulitis:  - continue full 24 hours of IV vanc. - likely able to switch to PO abx tomorrow.  - analgesia as needed.  2. AKI:  - resolved.   - likely secondary to dehydration  Ray GrimJeffrey H Colonel Krauser, MD 05/09/2015 4:37 PM

## 2015-05-09 NOTE — Care Management Note (Signed)
Case Management Note  Patient Details  Name: Cassandria AngerRoger N Tubby MRN: [id number] Date of Birth: 05/04/1951  Subjective/Objective:                    Action/Plan: UR completed   Expected Discharge Date:  05/10/15               Expected Discharge Plan:  Home/Self Care  In-House Referral:     Discharge planning Services     Post Acute Care Choice:    Choice offered to:     DME Arranged:    DME Agency:     HH Arranged:    HH Agency:     Status of Service:  In process, will continue to follow  Medicare Important Message Given:    Date Medicare IM Given:    Medicare IM give by:    Date Additional Medicare IM Given:    Additional Medicare Important Message give by:     If discussed at Long Length of Stay Meetings, dates discussed:    Additional Comments:  Kingsley PlanWile, Dareld Mcauliffe Marie, RN 05/09/2015, 9:30 AM

## 2015-05-09 NOTE — Progress Notes (Signed)
Family Medicine Teaching Service Daily Progress Note Intern Pager: [phone]  Patient name: Ray AngerRoger N Patel Medical record number: [id number] Date of birth: 04/26/1951 Age: 64 y.o. Gender: male  Primary Care Provider: Abbe AmsterdamOPLAND,JESSICA, MD Consultants: none  Code Status: full  Pt Overview and Major Events to Date:  07/07: Admitted for RLE cellulitis  Assessment and Plan: 64 yo M with PMH of HTN and HLD presents with right shin cellulitis following a laceration at work on 07/01.   1.RLE cellulitis: diagnosed with cellulitis on July 1 after cutting his leg at work. Soft tissue US at ED at the time showed no abscess but cellulitis present. Finished six days of Bactrim without improvement of symptoms. Afebrile, LA and WBC WNL on admission. - Admitted to floor under attending Dr. Gwendolyn GrantWalden - Start IV vanc - Tylenol PRN pain or fever  - Oxycodone PRN breakthrough pain -Monitor erythematous spreading  2. AKI: - first noted 7/1.Will recheck Cr levels. Now elevated to 1.46.  - Monitor vanc with AKI.  - Check FeNa. Assume dehydration. Need to ask about NSAID use - Rehydrate and recheck creatinine in AM.    FEN/GI: none PPx: Lovenox  Disposition: stable. Home once transition to PO abx  Subjective:  64 yo M  presents with right shin cellulitis following a laceration at work on 07/01.  No acute events overnight, patient has remained stable. Patient's wound erythema and swelling has already improved. The erythema has retracted well within the borders that were drawn with a sharpie. No discharge at this time. Patient still has pain in the area. He otherwise has no complaints.    Objective: Temp:  [98.2 F (36.8 C)-99 F (37.2 C)] 98.4 F (36.9 C) (07/08 0528) Pulse Rate:  [66-85] 69 (07/08 0528) Resp:  [16-19] 18 (07/08 0528) BP: (121-153)/(54-93) 133/65 mmHg (07/08 0528) SpO2:  [95 %-100 %] 98 % (07/08 0528) Weight:  [200 lb (90.719 kg)-207 lb (93.895 kg)] 200 lb (90.719 kg)  (07/07 1529) Physical Exam: General: pleasant well-appearing obese male lying in bed Cardiovascular: RRR, no murmurs appreciated Respiratory: CTAB, no wheezes Abdomen: soft, non-tender, non-distended, +BS MSK: pain upon palpation of R shin Skin: 10 in ft x 4 warm erythematous area on R shin with central lesion containing sutures and no purulence, no fluctuance or active drainage, perimeter outlined with Sharpie. Erythema and swelling has improved since yesterday.   Laboratory:  Recent Labs Lab 05/02/15 1817 05/08/15 0950 05/09/15 0357  WBC 17.6* 8.4 8.0  HGB 15.3 13.3 12.8*  HCT 46.1 38.1* 37.3*  PLT 190 255 267    Recent Labs Lab 05/02/15 1817 05/02/15 2111 05/08/15 0950 05/09/15 0357  NA 140 140 134* 136  K 3.9 3.3* 3.9 4.1  CL 103 107 101 104  CO2 24 25 24 24   BUN 25* 24* 23* 15  CREATININE 1.30* 1.14 1.46* 1.12  CALCIUM 9.2 8.1* 8.5* 7.9*  PROT 7.8  --   --   --   BILITOT 0.4  --   --   --   ALKPHOS 79  --   --   --   ALT 33  --   --   --   AST 34  --   --   --   GLUCOSE 106* 113* 132* 93     Imaging/Diagnostic Tests: No results found.   Beaulah Dinninghristina M Taisley Mordan, MD 05/09/2015, 8:09 AM PGY-1, [place] [facility] FPTS Intern pager: [phone], text pages welcome

## 2015-05-10 MED ORDER — CLINDAMYCIN HCL 300 MG PO CAPS
300.0000 mg | ORAL_CAPSULE | Freq: Four times a day (QID) | ORAL | Status: DC
  Administered 2015-05-10 – 2015-05-11 (×4): 300 mg via ORAL
  Filled 2015-05-10 (×4): qty 1

## 2015-05-10 MED ORDER — DOXYCYCLINE HYCLATE 100 MG PO TABS
100.0000 mg | ORAL_TABLET | Freq: Two times a day (BID) | ORAL | Status: DC
  Administered 2015-05-10 – 2015-05-11 (×2): 100 mg via ORAL
  Filled 2015-05-10 (×2): qty 1

## 2015-05-10 NOTE — Progress Notes (Signed)
Family Medicine Teaching Service Daily Progress Note Intern Pager: [phone]  Patient name: Ray AngerRoger N Patel Medical record number: [id number] Date of birth: 05/23/1951 Age: 64 y.o. Gender: male  Primary Care Provider: Abbe AmsterdamOPLAND,JESSICA, MD Consultants: none  Code Status: full  Pt Overview and Major Events to Date:  07/07: Admitted for RLE cellulitis, vancomycin started 07/09: Improving, switch vanc to clinda and doxy.   Assessment and Plan: 64 yo M with PMH of HTN and HLD presents with right shin cellulitis following a laceration at work on 07/01 which failed outpatient treatment with po antibiotics.   1.RLE cellulitis: U/S negative for abscess. Erythema receding from margins slowly, still significant without fluctuance. No signs or symptoms of sepsis.  - Vancomycin (7/7 - 7/9) - Transition to po antibiotics 7/9: clindamycin and doxycycline.  - Tylenol PRN pain or fever. Has not required any analgesics.   2. AKI: Resolved.   FEN/GI: Regular diet, saline lock.  PPx: Lovenox  Disposition: Discharge if still improving 7/10 on oral antibiotics.   Subjective:  Continues to feel better with less pain and no fever or feeling ill.   Objective: Temp:  [98.4 F (36.9 C)-98.6 F (37 C)] 98.4 F (36.9 C) (07/09 0555) Pulse Rate:  [72] 72 (07/09 0555) Resp:  [16] 16 (07/09 0555) BP: (128-136)/(54-67) 136/67 mmHg (07/09 0555) SpO2:  [97 %-100 %] 100 % (07/09 0555) Physical Exam: General: pleasant well-appearing obese male lying in bed Cardiovascular: Regular rate, no murmur Respiratory: Nonlabored, clear Abdomen: +BS, soft, non-tender, non-distended Right leg: ~8in x 4 in warm erythematous area on R shin with central nondraining wound without fluctuance or purulence.   Laboratory:  Recent Labs Lab 05/08/15 0950 05/09/15 0357  WBC 8.4 8.0  HGB 13.3 12.8*  HCT 38.1* 37.3*  PLT 255 267    Recent Labs Lab 05/08/15 0950 05/09/15 0357  NA 134* 136  K 3.9 4.1  CL 101 104   CO2 24 24  BUN 23* 15  CREATININE 1.46* 1.12  CALCIUM 8.5* 7.9*  GLUCOSE 132* 93   Tyrone Nineyan B Kamylle Axelson, MD 05/10/2015, 3:20 PM PGY-3, [facility] FPTS Intern pager: [phone], text pages welcome

## 2015-05-11 MED ORDER — DOXYCYCLINE HYCLATE 100 MG PO TABS: 100.0000 mg | ORAL_TABLET | Freq: Two times a day (BID) | ORAL | Status: AC

## 2015-05-11 MED ORDER — CLINDAMYCIN HCL 300 MG PO CAPS: 300.0000 mg | ORAL_CAPSULE | Freq: Four times a day (QID) | ORAL | Status: AC

## 2015-05-11 NOTE — Progress Notes (Signed)
Family Medicine Teaching Service Daily Progress Note Intern Pager: [phone]  Patient name: Ray Patel Medical record number: [id number] Date of birth: 08/02/1951 Age: 64 y.o. Gender: male  Primary Care Provider: Abbe AmsterdamOPLAND,JESSICA, MD Consultants: none Code Status: full code   Pt Overview and Major Events to Date:  07/07: Admitted for RLE cellulitis  Assessment and Plan: 64 yo M with PMH of HTN and HLD presents with right shin cellulitis following a laceration at work on 07/01.   1.RLE cellulitis: diagnosed with cellulitis on July 1 after cutting his leg at work. Soft tissue US at ED at the time showed no abscess but cellulitis present. Finished six days of Bactrim without improvement of symptoms. Afebrile, LA and WBC WNL on admission. - Admitted to floor under attending Dr. Gwendolyn GrantWalden - s/p IV vanc - Tylenol PRN pain or fever  - Oxycodone PRN breakthrough pain -Monitor erythematous spreading -Continue PO clinda and doxy and send home  2. AKI: - first noted 7/1.Cr levels were elevated - Resolved -Continue to encourage oral rehydration   FEN/GI:  PPx: Lovenox  Disposition: stable. Home  Subjective:  64 yo M presents with right shin cellulitis following a laceration at work on 07/01. Patient was seen this morning sitting up in bed eating breakfast. He is in NAD and had no acute events overnight. His vital signs remain stable. He does admit to some pain still in his right shin where the cellulitis is, but it is better than it was yesterday. He has no other complaints at this time.   Objective: Temp:  [97.9 F (36.6 C)-98.2 F (36.8 C)] 98 F (36.7 C) (07/10 0643) Pulse Rate:  [63-98] 63 (07/10 0643) Resp:  [16-20] 20 (07/10 0643) BP: (120-130)/(53-64) 123/53 mmHg (07/10 0643) SpO2:  [98 %-99 %] 99 % (07/10 21300643) Physical Exam: General: pleasant well-appearing obese male lying in bed Cardiovascular: RRR, no murmurs appreciated Respiratory: CTAB, no wheezes Abdomen:  soft, non-tender, non-distended, +BS MSK: pain upon palpation of R shin Skin: ~9 in  x 4 in warm erythematous area on R shin with central  eschar lesion containing sutures and no purulence, no fluctuance or active drainage, perimeter outlined with Sharpie. Erythema and swelling has improved since yesterday.   Laboratory:  Recent Labs Lab 05/08/15 0950 05/09/15 0357  WBC 8.4 8.0  HGB 13.3 12.8*  HCT 38.1* 37.3*  PLT 255 267    Recent Labs Lab 05/08/15 0950 05/09/15 0357  NA 134* 136  K 3.9 4.1  CL 101 104  CO2 24 24  BUN 23* 15  CREATININE 1.46* 1.12  CALCIUM 8.5* 7.9*  GLUCOSE 132* 93     Imaging/Diagnostic Tests: No results found.   Beaulah Dinninghristina M Brantley Wiley, MD 05/11/2015, 7:28 AM PGY-1, [place] [facility] FPTS Intern pager: [phone], text pages welcome

## 2015-05-11 NOTE — Discharge Instructions (Signed)
You were admitted for cellulitis of your right shin. You were given antibiotics for the infection and will be sent home with 2 different antibiotic prescriptions (Doxycyclin and Clindamycin). It is important to take both of these antibiotics until there are no more left.  You can go back to work. We discussed whether or not you will need a bandage on your shin, it is not necessary. If you feel more comfortable with a bandage, wrap your shin loosely with a clean dry gauze to ensure the area stays airated.   Cellulitis Cellulitis is an infection of the skin and the tissue beneath it. The infected area is usually red and tender. Cellulitis occurs most often in the arms and lower legs.  CAUSES  Cellulitis is caused by bacteria that enter the skin through cracks or cuts in the skin. The most common types of bacteria that cause cellulitis are staphylococci and streptococci. SIGNS AND SYMPTOMS   Redness and warmth.  Swelling.  Tenderness or pain.  Fever. DIAGNOSIS  Your health care provider can usually determine what is wrong based on a physical exam. Blood tests may also be done. TREATMENT  Treatment usually involves taking an antibiotic medicine. HOME CARE INSTRUCTIONS   Take your antibiotic medicine as directed by your health care provider. Finish the antibiotic even if you start to feel better.  Keep the infected arm or leg elevated to reduce swelling.  Apply a warm cloth to the affected area up to 4 times per day to relieve pain.  Take medicines only as directed by your health care provider.  Keep all follow-up visits as directed by your health care provider. SEEK MEDICAL CARE IF:   You notice red streaks coming from the infected area.  Your red area gets larger or turns dark in color.  Your bone or joint underneath the infected area becomes painful after the skin has healed.  Your infection returns in the same area or another area.  You notice a swollen bump in the infected  area.  You develop new symptoms.  You have a fever. SEEK IMMEDIATE MEDICAL CARE IF:   You feel very sleepy.  You develop vomiting or diarrhea.  You have a general ill feeling (malaise) with muscle aches and pains. MAKE SURE YOU:   Understand these instructions.  Will watch your condition.  Will get help right away if you are not doing well or get worse. Document Released: 07/28/2005 Document Revised: 03/04/2014 Document Reviewed: 01/03/2012 [facility] Patient Information 2015 [place]. This information is not intended to replace advice given to you by your health care provider. Make sure you discuss any questions you have with your health care provider.

## 2015-05-11 NOTE — Progress Notes (Signed)
Pt discharged to home accomp by family via ambulatory.  Pt given copy of DC instructions and reviewed.  [facility]  Pt understands follow up appt.

## 2015-05-11 NOTE — Discharge Summary (Signed)
Family Medicine Teaching [facility] Discharge Summary  Patient name: Ray Patel Medical record number: [id number] Date of birth: 02/08/1951 Age: 64 y.o. Gender: male Date of Admission: 05/08/2015  Date of Discharge: 05/11/2015 Admitting Physician: Tobey GrimJeffrey H Walden, MD  Primary Care Provider: Abbe AmsterdamOPLAND,JESSICA, MD Consultants: none  Indication for Hospitalization: Cellulitis  Discharge Diagnoses/Problem List:  Cellulitis HTN HLD  Disposition: home  Discharge Condition: stable  Discharge Exam:  General: pleasant well-appearing obese male lying in bed Cardiovascular: RRR, normal s1 and s2, no murmurs appreciated Respiratory: CTAB, no wheezes Abdomen: soft, non-tender, non-distended, +BS MSK: pain upon palpation of R shin Skin: ~9 in x 4 in warm erythematous area on R shin with central eschar lesion containing sutures and no purulence, no fluctuance or active drainage, perimeter outlined with Sharpie. Erythema and swelling has improved since yesterday.  Brief Hospital Course:  64 yo M with PMH of HTN and HLD presented with right shin cellulitis following a laceration at work on 07/01. He developed erythema, swelling,and pain of the RLE. He received stitches and was placed on Bactrim in the ED at [facility] on 07/01. The erythema, pain, and swelling persisted and discharge was noted in area where the stitches were so [facility] on 07/07. When patient presented to the ED, he was afebrile and had already completed his course of Bactrim. U/S was done which showed no abscess. Only cellulitis was present. WBC were WNL on admission and remained stable throughout hospitalization. He was started on IV Vancomycin from 07/07-07/09 for a total of 48 hours. He was noted to have an AKI with elevated Creatinine of 1.46 which we monitored while on Vancomycin. AKI resolved on 07/08. Patient was started on PO doxycycline and clindamycin on 07/09 which he will  take for 6 days. His RLE erythema, swelling, and tenderness all decreased during his stay but are not completely resolved. He remains afebrile and is stable to go home.  Issues for Follow Up:  1. Cellulitis of RLE  Significant Procedures: none  Significant Labs and Imaging:   Recent Labs Lab 05/08/15 0950 05/09/15 0357  WBC 8.4 8.0  HGB 13.3 12.8*  HCT 38.1* 37.3*  PLT 255 267    Recent Labs Lab 05/08/15 0950 05/09/15 0357  NA 134* 136  K 3.9 4.1  CL 101 104  CO2 24 24  GLUCOSE 132* 93  BUN 23* 15  CREATININE 1.46* 1.12  CALCIUM 8.5* 7.9*     Results/Tests Pending at Time of Discharge: none  Discharge Medications:    Medication List    STOP taking these medications        cephALEXin 500 MG capsule  Commonly known as:  KEFLEX     sulfamethoxazole-trimethoprim 800-160 MG per tablet  Commonly known as:  BACTRIM DS,SEPTRA DS      TAKE these medications        amLODipine 10 MG tablet  Commonly known as:  NORVASC  Take 1 tablet (10 mg total) by mouth daily.     atorvastatin 40 MG tablet  Commonly known as:  LIPITOR  Take 1 tablet (40 mg total) by mouth daily.     clindamycin 300 MG capsule  Commonly known as:  CLEOCIN  Take 1 capsule (300 mg total) by mouth every 6 (six) hours.     doxycycline 100 MG tablet  Commonly known as:  VIBRA-TABS  Take 1 tablet (100 mg total) by mouth every 12 (twelve) hours.  EPINEPHrine 0.3 mg/0.3 mL Soaj injection  Commonly known as:  EPI-PEN  Inject 0.3 mLs (0.3 mg total) into the muscle once.     hydrochlorothiazide 12.5 MG tablet  Commonly known as:  HYDRODIURIL  Take 12.5 mg by mouth daily.     meloxicam 15 MG tablet  Commonly known as:  MOBIC  Take 1 tablet (15 mg total) by mouth daily.     PROAIR HFA 108 (90 BASE) MCG/ACT inhaler  Generic drug:  albuterol  Inhale 1 puff into the lungs every 4 (four) hours as needed. wheezing        Discharge Instructions: Please refer to Patient Instructions section  of EMR for full details.  Patient was counseled important signs and symptoms that should prompt return to medical care, changes in medications, dietary instructions, activity restrictions, and follow up appointments.   Follow-Up Appointments:  1 week with PCP   Beaulah Dinning, MD 05/11/2015, 12:37 PM PGY-1, [facility] Family Medicine

## 2015-10-15 ENCOUNTER — Ambulatory Visit (INDEPENDENT_AMBULATORY_CARE_PROVIDER_SITE_OTHER): Payer: No Typology Code available for payment source | Admitting: Family Medicine

## 2015-10-15 VITALS — BP 122/72 | HR 75 | Temp 98.0°F | Resp 16 | Ht 70.75 in | Wt 206.2 lb

## 2015-10-15 DIAGNOSIS — I10 Essential (primary) hypertension: Secondary | ICD-10-CM | POA: Diagnosis not present

## 2015-10-15 DIAGNOSIS — E7800 Pure hypercholesterolemia, unspecified: Secondary | ICD-10-CM

## 2015-10-15 DIAGNOSIS — G8929 Other chronic pain: Secondary | ICD-10-CM

## 2015-10-15 DIAGNOSIS — M549 Dorsalgia, unspecified: Secondary | ICD-10-CM | POA: Diagnosis not present

## 2015-10-15 DIAGNOSIS — Z23 Encounter for immunization: Secondary | ICD-10-CM

## 2015-10-15 LAB — CBC
HCT: 46.5 % (ref 39.0–52.0)
Hemoglobin: 15.9 g/dL (ref 13.0–17.0)
MCH: 29.7 pg (ref 26.0–34.0)
MCHC: 34.2 g/dL (ref 30.0–36.0)
MCV: 86.9 fL (ref 78.0–100.0)
MPV: 11.1 fL (ref 8.6–12.4)
Platelets: 238 10*3/uL (ref 150–400)
RBC: 5.35 MIL/uL (ref 4.22–5.81)
RDW: 13.3 % (ref 11.5–15.5)
WBC: 8.3 10*3/uL (ref 4.0–10.5)

## 2015-10-15 LAB — LIPID PANEL
CHOL/HDL RATIO: 4.1 ratio (ref <=5.0)
Cholesterol: 139 mg/dL (ref 125–200)
HDL: 34 mg/dL — ABNORMAL LOW (ref >=40)
LDL Cholesterol: 75 mg/dL (ref <130)
Triglycerides: 150 mg/dL — ABNORMAL HIGH (ref <150)
VLDL: 30 mg/dL (ref <30)

## 2015-10-15 LAB — COMPREHENSIVE METABOLIC PANEL
ALT: 30 U/L (ref 9–46)
AST: 24 U/L (ref 10–35)
Albumin: 3.9 g/dL (ref 3.6–5.1)
Alkaline Phosphatase: 79 U/L (ref 40–115)
BILIRUBIN TOTAL: 0.7 mg/dL (ref 0.2–1.2)
BUN: 22 mg/dL (ref 7–25)
CO2: 27 mmol/L (ref 20–31)
Calcium: 9.2 mg/dL (ref 8.6–10.3)
Chloride: 102 mmol/L (ref 98–110)
Creat: 0.93 mg/dL (ref 0.70–1.25)
GLUCOSE: 100 mg/dL — AB (ref 65–99)
Potassium: 4.2 mmol/L (ref 3.5–5.3)
Sodium: 136 mmol/L (ref 135–146)
Total Protein: 7.3 g/dL (ref 6.1–8.1)

## 2015-10-15 MED ORDER — HYDROCHLOROTHIAZIDE 12.5 MG PO TABS: 12.5000 mg | ORAL_TABLET | Freq: Every day | ORAL | Status: DC

## 2015-10-15 MED ORDER — MELOXICAM 15 MG PO TABS: 15.0000 mg | ORAL_TABLET | Freq: Every day | ORAL | Status: DC

## 2015-10-15 MED ORDER — AMLODIPINE BESYLATE 10 MG PO TABS: 10.0000 mg | ORAL_TABLET | Freq: Every day | ORAL | Status: DC

## 2015-10-15 MED ORDER — ATORVASTATIN CALCIUM 40 MG PO TABS: 40.0000 mg | ORAL_TABLET | Freq: Every day | ORAL | Status: DC
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
## 2015-10-15 NOTE — Patient Instructions (Signed)
It was great to see you today- I will be in touch with your labs asap Your blood pressure looks good Wt Readings from Last 3 Encounters:  10/15/15 206 lb 3.2 oz (93.532 kg)  05/08/15 200 lb (90.719 kg)  05/02/15 208 lb (94.348 kg)   Your weight is holding about steady- keep an eye on your diet!  You can get your shingles vaccine a month afer your pneumonia vaccine that you got today  You should receive a call from the Cologuard people soon to discuss your test- let us know if you do not hear

## 2015-11-21 ENCOUNTER — Encounter: Payer: Self-pay | Admitting: Family Medicine

## 2015-11-26 ENCOUNTER — Encounter: Payer: Self-pay | Admitting: Family Medicine

## 2015-12-17 ENCOUNTER — Other Ambulatory Visit: Payer: Self-pay | Admitting: Physician Assistant

## 2016-01-08 ENCOUNTER — Other Ambulatory Visit: Payer: Self-pay | Admitting: Physician Assistant

## 2016-01-08 DIAGNOSIS — Z23 Encounter for immunization: Secondary | ICD-10-CM

## 2016-01-08 MED ORDER — ZOSTER VACCINE LIVE 19400 UNT/0.65ML ~~LOC~~ SOLR: 0.6500 mL | Freq: Once | SUBCUTANEOUS | Status: DC

## 2016-02-07 IMAGING — CR DG CHEST 2V
2 series · 2 of 2 positions shown · non-contrast
Comparison: 11/24/2009

CLINICAL DATA: Initial encounter for 2 week history of cough

EXAM:
CHEST  2 VIEW

[PA]
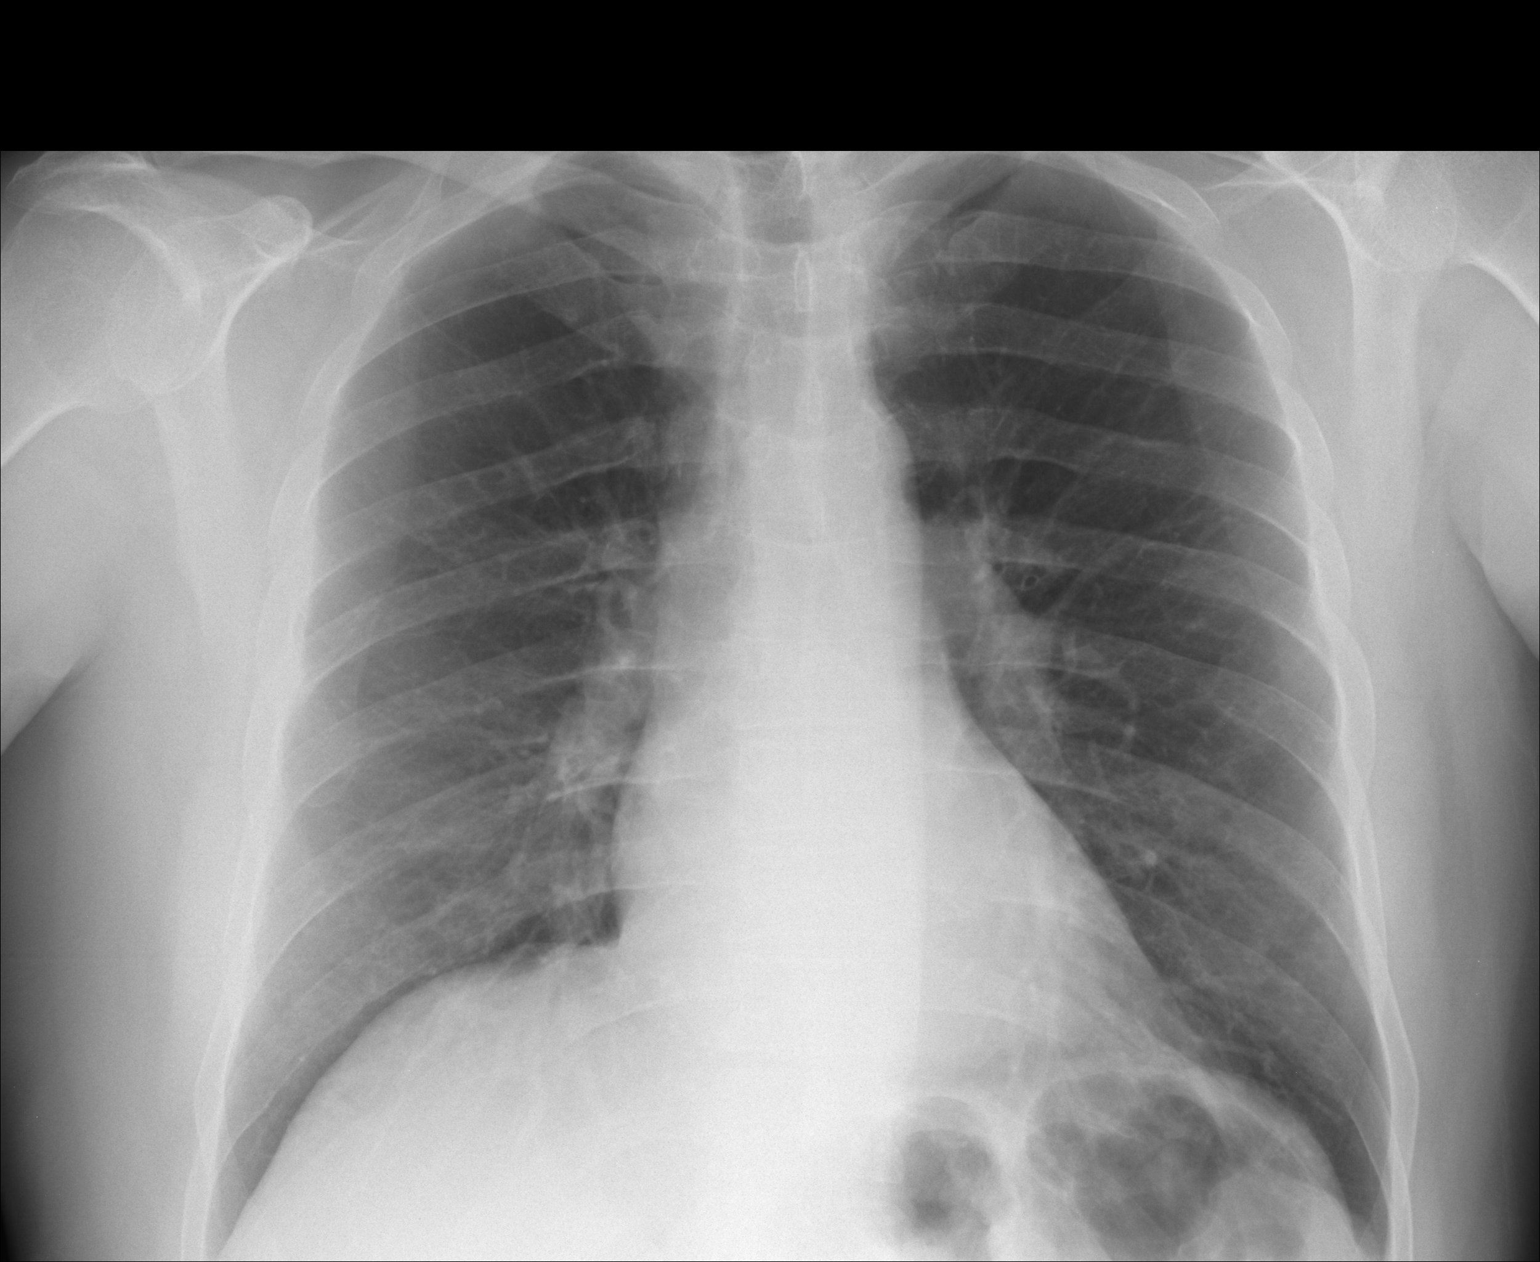

[lateral]
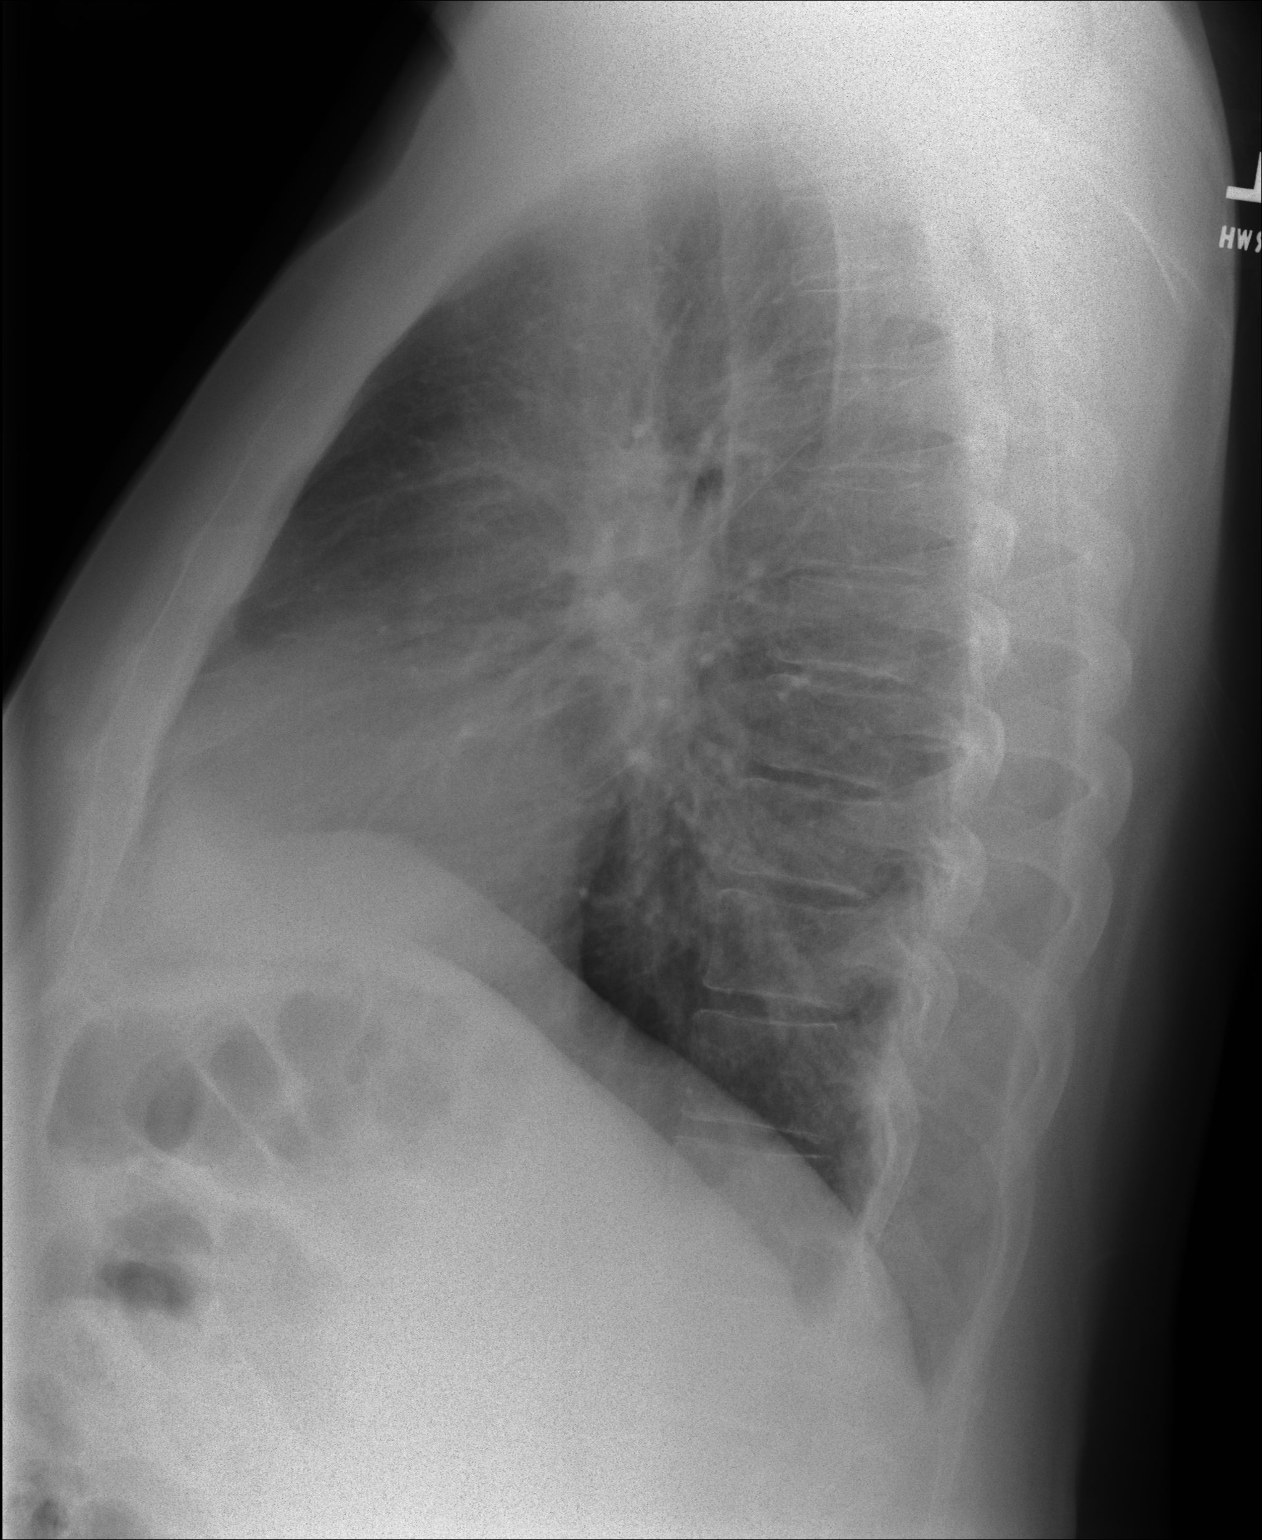

[2 of 2 positions shown; findings below may reference images not displayed]

FINDINGS: The heart size and mediastinal contours are within normal limits.
Both lungs are clear. The visualized skeletal structures are
unremarkable.
IMPRESSION: No active cardiopulmonary disease.

## 2016-04-27 ENCOUNTER — Ambulatory Visit (INDEPENDENT_AMBULATORY_CARE_PROVIDER_SITE_OTHER): Payer: BLUE CROSS/BLUE SHIELD | Admitting: Physician Assistant

## 2016-04-27 ENCOUNTER — Ambulatory Visit (INDEPENDENT_AMBULATORY_CARE_PROVIDER_SITE_OTHER): Payer: BLUE CROSS/BLUE SHIELD

## 2016-04-27 VITALS — BP 130/76 | HR 98 | Temp 101.0°F | Resp 17 | Ht 70.75 in | Wt 213.0 lb

## 2016-04-27 DIAGNOSIS — M791 Myalgia: Secondary | ICD-10-CM

## 2016-04-27 DIAGNOSIS — R509 Fever, unspecified: Secondary | ICD-10-CM

## 2016-04-27 LAB — POCT URINALYSIS DIP (MANUAL ENTRY)
Blood, UA: NEGATIVE
Glucose, UA: NEGATIVE
Leukocytes, UA: NEGATIVE
Nitrite, UA: NEGATIVE
SPEC GRAV UA: 1.02
Urobilinogen, UA: 0.2
pH, UA: 5

## 2016-04-27 LAB — POCT CBC
GRANULOCYTE PERCENT: 93.4 % — AB (ref 37–80)
HCT, POC: 43.9 % (ref 43.5–53.7)
Hemoglobin: 15.9 g/dL (ref 14.1–18.1)
LYMPH, POC: 0.9 (ref 0.6–3.4)
MCH, POC: 30.9 pg (ref 27–31.2)
MCHC: 36.2 g/dL — AB (ref 31.8–35.4)
MCV: 85.5 fL (ref 80–97)
MID (cbc): 0.5 (ref 0–0.9)
MPV: 8.8 fL (ref 0–99.8)
PLATELET COUNT, POC: 172 10*3/uL (ref 142–424)
POC Granulocyte: 20.5 — AB (ref 2–6.9)
POC LYMPH %: 4.2 % — AB (ref 10–50)
POC MID %: 2.4 %M (ref 0–12)
RBC: 2.13 M/uL — AB (ref 4.69–6.13)
RDW, POC: 13.3 %
WBC: 22 10*3/uL — AB (ref 4.6–10.2)

## 2016-04-27 LAB — POCT INFLUENZA A/B
Influenza A, POC: NEGATIVE
Influenza B, POC: NEGATIVE

## 2016-04-27 LAB — POCT RAPID STREP A (OFFICE): Rapid Strep A Screen: NEGATIVE

## 2016-04-27 MED ORDER — DOXYCYCLINE HYCLATE 100 MG PO CAPS: 100.0000 mg | ORAL_CAPSULE | Freq: Two times a day (BID) | ORAL | Status: AC

## 2016-04-27 MED ORDER — IBUPROFEN 600 MG PO TABS: 600.0000 mg | ORAL_TABLET | Freq: Three times a day (TID) | ORAL | Status: DC | PRN
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
## 2016-04-27 NOTE — Patient Instructions (Signed)
     IF you received an x-ray today, you will receive an invoice from [facility]. Please contact [facility] at [phone] with questions or concerns regarding your invoice.   IF you received labwork today, you will receive an invoice from [facility]. Please contact [facility] at [phone] with questions or concerns regarding your invoice.   Our billing staff will not be able to assist you with questions regarding bills from these companies.  You will be contacted with the lab results as soon as they are available. The fastest way to get your results is to activate your My Chart account. Instructions are located on the last page of this paperwork. If you have not heard from us regarding the results in 2 weeks, please contact this office.      

## 2016-04-28 LAB — COMPLETE METABOLIC PANEL WITH GFR
ALT: 56 U/L — AB (ref 9–46)
AST: 36 U/L — AB (ref 10–35)
Albumin: 4.1 g/dL (ref 3.6–5.1)
Alkaline Phosphatase: 71 U/L (ref 40–115)
BUN: 26 mg/dL — AB (ref 7–25)
CHLORIDE: 100 mmol/L (ref 98–110)
CO2: 27 mmol/L (ref 20–31)
CREATININE: 1.13 mg/dL (ref 0.70–1.25)
Calcium: 9.7 mg/dL (ref 8.6–10.3)
GFR, Est African American: 78 mL/min (ref >=60)
GFR, Est Non African American: 67 mL/min (ref >=60)
GLUCOSE: 101 mg/dL — AB (ref 65–99)
Potassium: 4.9 mmol/L (ref 3.5–5.3)
Sodium: 140 mmol/L (ref 135–146)
Total Bilirubin: 0.6 mg/dL (ref 0.2–1.2)
Total Protein: 7.5 g/dL (ref 6.1–8.1)

## 2016-04-28 LAB — PSA: PSA: 4.58 ng/mL — ABNORMAL HIGH (ref <=4.00)

## 2016-04-29 LAB — CULTURE, GROUP A STREP: ORGANISM ID, BACTERIA: NORMAL

## 2016-10-11 ENCOUNTER — Other Ambulatory Visit: Payer: Self-pay | Admitting: Family Medicine

## 2016-10-11 ENCOUNTER — Other Ambulatory Visit: Payer: Self-pay | Admitting: Emergency Medicine

## 2016-10-11 DIAGNOSIS — E7800 Pure hypercholesterolemia, unspecified: Secondary | ICD-10-CM

## 2016-10-11 DIAGNOSIS — I10 Essential (primary) hypertension: Secondary | ICD-10-CM

## 2016-10-11 MED ORDER — AMLODIPINE BESYLATE 10 MG PO TABS: 10.0000 mg | ORAL_TABLET | Freq: Every day | ORAL | 0 refills | Status: DC

## 2016-10-11 MED ORDER — ATORVASTATIN CALCIUM 40 MG PO TABS: 40.0000 mg | ORAL_TABLET | Freq: Every day | ORAL | 0 refills | Status: DC

## 2016-10-20 ENCOUNTER — Other Ambulatory Visit: Payer: Self-pay | Admitting: Family Medicine

## 2016-10-20 DIAGNOSIS — I10 Essential (primary) hypertension: Secondary | ICD-10-CM

## 2016-11-16 ENCOUNTER — Other Ambulatory Visit: Payer: Self-pay | Admitting: Emergency Medicine

## 2016-11-16 ENCOUNTER — Other Ambulatory Visit: Payer: Self-pay | Admitting: Family Medicine

## 2016-11-16 DIAGNOSIS — G8929 Other chronic pain: Secondary | ICD-10-CM

## 2016-11-16 DIAGNOSIS — M549 Dorsalgia, unspecified: Principal | ICD-10-CM

## 2016-11-16 MED ORDER — MELOXICAM 15 MG PO TABS: 15.0000 mg | ORAL_TABLET | Freq: Every day | ORAL | 3 refills | Status: DC

## 2016-11-16 NOTE — Telephone Encounter (Signed)
Received refill request for meloxicam (MOBIC) 15 MG tablet. Pt last seen and last refill 10/15/2015. Are you ok with refill? Please advise.

## 2016-11-17 ENCOUNTER — Ambulatory Visit: Payer: BLUE CROSS/BLUE SHIELD

## 2016-12-07 ENCOUNTER — Other Ambulatory Visit: Payer: Self-pay | Admitting: Family Medicine

## 2016-12-07 DIAGNOSIS — E7800 Pure hypercholesterolemia, unspecified: Secondary | ICD-10-CM

## 2016-12-11 ENCOUNTER — Ambulatory Visit (INDEPENDENT_AMBULATORY_CARE_PROVIDER_SITE_OTHER): Payer: BLUE CROSS/BLUE SHIELD | Admitting: Physician Assistant

## 2016-12-11 VITALS — BP 130/80 | HR 62 | Temp 97.6°F | Resp 17 | Ht 71.5 in | Wt 209.0 lb

## 2016-12-11 DIAGNOSIS — I10 Essential (primary) hypertension: Secondary | ICD-10-CM | POA: Diagnosis not present

## 2016-12-11 DIAGNOSIS — Z23 Encounter for immunization: Secondary | ICD-10-CM

## 2016-12-11 DIAGNOSIS — Z5320 Procedure and treatment not carried out because of patient's decision for unspecified reasons: Secondary | ICD-10-CM | POA: Diagnosis not present

## 2016-12-11 DIAGNOSIS — E7800 Pure hypercholesterolemia, unspecified: Secondary | ICD-10-CM | POA: Diagnosis not present

## 2016-12-11 LAB — POCT URINALYSIS DIP (MANUAL ENTRY)
BILIRUBIN UA: NEGATIVE
BILIRUBIN UA: NEGATIVE
Blood, UA: NEGATIVE
GLUCOSE UA: NEGATIVE
LEUKOCYTES UA: NEGATIVE
Nitrite, UA: NEGATIVE
Protein Ur, POC: NEGATIVE
Spec Grav, UA: 1.02
Urobilinogen, UA: 0.2
pH, UA: 6.5

## 2016-12-11 MED ORDER — ATENOLOL 25 MG PO TABS: 12.0000 mg | ORAL_TABLET | Freq: Every day | ORAL | 0 refills | Status: DC

## 2016-12-11 MED ORDER — HYDROCHLOROTHIAZIDE 12.5 MG PO TABS: 12.5000 mg | ORAL_TABLET | Freq: Every day | ORAL | 3 refills | Status: DC

## 2016-12-11 MED ORDER — ATORVASTATIN CALCIUM 40 MG PO TABS: 40.0000 mg | ORAL_TABLET | Freq: Every day | ORAL | 3 refills | Status: DC

## 2016-12-11 MED ORDER — AMLODIPINE BESYLATE 10 MG PO TABS: 10.0000 mg | ORAL_TABLET | Freq: Every day | ORAL | 3 refills | Status: DC
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
## 2016-12-11 NOTE — Patient Instructions (Addendum)
Please consider your options for colon cancer screening and get back to me as soon as possible.     IF you received an x-ray today, you will receive an invoice from [facility]. Please contact [facility] at [phone] with questions or concerns regarding your invoice.   IF you received labwork today, you will receive an invoice from [facility]. Please contact [facility] at [phone] with questions or concerns regarding your invoice.   Our billing staff will not be able to assist you with questions regarding bills from these companies.  You will be contacted with the lab results as soon as they are available. The fastest way to get your results is to activate your My Chart account. Instructions are located on the last page of this paperwork. If you have not heard from us regarding the results in 2 weeks, please contact this office.

## 2016-12-12 LAB — CMP14+EGFR
ALBUMIN: 3.8 g/dL (ref 3.6–4.8)
ALK PHOS: 77 IU/L (ref 39–117)
ALT: 24 IU/L (ref 0–44)
AST: 20 IU/L (ref 0–40)
Albumin/Globulin Ratio: 1.3 (ref 1.2–2.2)
BUN / CREAT RATIO: 26 — AB (ref 10–24)
BUN: 23 mg/dL (ref 8–27)
Bilirubin Total: 0.3 mg/dL (ref 0.0–1.2)
CO2: 26 mmol/L (ref 18–29)
CREATININE: 0.87 mg/dL (ref 0.76–1.27)
Calcium: 9 mg/dL (ref 8.6–10.2)
Chloride: 102 mmol/L (ref 96–106)
GFR calc Af Amer: 103 mL/min/{1.73_m2} (ref >59)
GFR calc non Af Amer: 89 mL/min/{1.73_m2} (ref >59)
GLUCOSE: 111 mg/dL — AB (ref 65–99)
Globulin, Total: 3 g/dL (ref 1.5–4.5)
Potassium: 4.6 mmol/L (ref 3.5–5.2)
Sodium: 142 mmol/L (ref 134–144)
TOTAL PROTEIN: 6.8 g/dL (ref 6.0–8.5)

## 2016-12-12 LAB — CBC
HEMOGLOBIN: 16 g/dL (ref 13.0–17.7)
Hematocrit: 46.1 % (ref 37.5–51.0)
MCH: 30.4 pg (ref 26.6–33.0)
MCHC: 34.7 g/dL (ref 31.5–35.7)
MCV: 88 fL (ref 79–97)
PLATELETS: 218 10*3/uL (ref 150–379)
RBC: 5.27 x10E6/uL (ref 4.14–5.80)
RDW: 13.7 % (ref 12.3–15.4)
WBC: 7.2 10*3/uL (ref 3.4–10.8)

## 2016-12-12 LAB — HEPATIC FUNCTION PANEL: BILIRUBIN, DIRECT: 0.11 mg/dL (ref 0.00–0.40)

## 2016-12-12 LAB — TSH: TSH: 2.36 u[IU]/mL (ref 0.450–4.500)

## 2016-12-13 NOTE — Addendum Note (Signed)
Addended by: Baldwin CrownJOHNSON, SHAQUETTA D on: 12/13/2016 05:06 PM   Modules accepted: Orders

## 2016-12-13 NOTE — Progress Notes (Signed)
Please add on a1c

## 2016-12-15 LAB — HGB A1C W/O EAG: Hgb A1c MFr Bld: 5.7 % — ABNORMAL HIGH (ref 4.8–5.6)

## 2016-12-15 LAB — SPECIMEN STATUS REPORT

## 2017-03-18 ENCOUNTER — Other Ambulatory Visit: Payer: Self-pay | Admitting: Family Medicine

## 2017-03-18 DIAGNOSIS — M549 Dorsalgia, unspecified: Principal | ICD-10-CM

## 2017-03-18 DIAGNOSIS — G8929 Other chronic pain: Secondary | ICD-10-CM

## 2017-03-18 NOTE — Telephone Encounter (Signed)
Hi Ray NovemberMike- came to me by mistake

## 2017-07-16 ENCOUNTER — Other Ambulatory Visit: Payer: Self-pay | Admitting: Physician Assistant

## 2017-07-16 DIAGNOSIS — M549 Dorsalgia, unspecified: Principal | ICD-10-CM

## 2017-07-16 DIAGNOSIS — G8929 Other chronic pain: Secondary | ICD-10-CM

## 2017-10-13 ENCOUNTER — Ambulatory Visit (INDEPENDENT_AMBULATORY_CARE_PROVIDER_SITE_OTHER): Payer: BLUE CROSS/BLUE SHIELD | Admitting: Physician Assistant

## 2017-10-13 ENCOUNTER — Other Ambulatory Visit: Payer: Self-pay

## 2017-10-13 ENCOUNTER — Encounter: Payer: Self-pay | Admitting: Physician Assistant

## 2017-10-13 VITALS — BP 146/84 | HR 70 | Resp 16 | Ht 71.0 in | Wt 214.0 lb

## 2017-10-13 DIAGNOSIS — M549 Dorsalgia, unspecified: Secondary | ICD-10-CM

## 2017-10-13 DIAGNOSIS — Z1321 Encounter for screening for nutritional disorder: Secondary | ICD-10-CM | POA: Diagnosis not present

## 2017-10-13 DIAGNOSIS — I10 Essential (primary) hypertension: Secondary | ICD-10-CM

## 2017-10-13 DIAGNOSIS — E7800 Pure hypercholesterolemia, unspecified: Secondary | ICD-10-CM

## 2017-10-13 DIAGNOSIS — Z13228 Encounter for screening for other metabolic disorders: Secondary | ICD-10-CM | POA: Diagnosis not present

## 2017-10-13 DIAGNOSIS — Z1329 Encounter for screening for other suspected endocrine disorder: Secondary | ICD-10-CM | POA: Diagnosis not present

## 2017-10-13 DIAGNOSIS — Z23 Encounter for immunization: Secondary | ICD-10-CM

## 2017-10-13 DIAGNOSIS — G8929 Other chronic pain: Secondary | ICD-10-CM

## 2017-10-13 DIAGNOSIS — Z130 Encounter for screening for diseases of the blood and blood-forming organs and certain disorders involving the immune mechanism: Secondary | ICD-10-CM

## 2017-10-13 MED ORDER — ATORVASTATIN CALCIUM 40 MG PO TABS: 40.0000 mg | ORAL_TABLET | Freq: Every day | ORAL | 3 refills | Status: DC

## 2017-10-13 MED ORDER — AMLODIPINE BESYLATE 10 MG PO TABS: 10.0000 mg | ORAL_TABLET | Freq: Every day | ORAL | 3 refills | Status: DC

## 2017-10-13 MED ORDER — CHLORTHALIDONE 25 MG PO TABS: 12.5000 mg | ORAL_TABLET | Freq: Every day | ORAL | 3 refills | Status: DC

## 2017-10-13 MED ORDER — MELOXICAM 15 MG PO TABS: 7.5000 mg | ORAL_TABLET | Freq: Every day | ORAL | 3 refills | Status: DC

## 2017-10-13 NOTE — Patient Instructions (Signed)
     IF you received an x-ray today, you will receive an invoice from [facility]. Please contact [facility] at [phone] with questions or concerns regarding your invoice.   IF you received labwork today, you will receive an invoice from [facility]. Please contact [facility] at [phone] with questions or concerns regarding your invoice.   Our billing staff will not be able to assist you with questions regarding bills from these companies.  You will be contacted with the lab results as soon as they are available. The fastest way to get your results is to activate your My Chart account. Instructions are located on the last page of this paperwork. If you have not heard from us regarding the results in 2 weeks, please contact this office.     
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
## 2017-10-14 LAB — LIPID PANEL
CHOL/HDL RATIO: 4.2 ratio (ref 0.0–5.0)
CHOLESTEROL TOTAL: 134 mg/dL (ref 100–199)
HDL: 32 mg/dL — ABNORMAL LOW (ref >39)
LDL CALC: 80 mg/dL (ref 0–99)
TRIGLYCERIDES: 112 mg/dL (ref 0–149)
VLDL Cholesterol Cal: 22 mg/dL (ref 5–40)

## 2017-10-14 LAB — CBC
HEMOGLOBIN: 15.2 g/dL (ref 13.0–17.7)
Hematocrit: 43.2 % (ref 37.5–51.0)
MCH: 31.2 pg (ref 26.6–33.0)
MCHC: 35.2 g/dL (ref 31.5–35.7)
MCV: 89 fL (ref 79–97)
Platelets: 231 10*3/uL (ref 150–379)
RBC: 4.87 x10E6/uL (ref 4.14–5.80)
RDW: 13.7 % (ref 12.3–15.4)
WBC: 8.2 10*3/uL (ref 3.4–10.8)

## 2017-10-14 LAB — HEPATIC FUNCTION PANEL
ALT: 27 IU/L (ref 0–44)
AST: 23 IU/L (ref 0–40)
Albumin: 3.9 g/dL (ref 3.6–4.8)
Alkaline Phosphatase: 85 IU/L (ref 39–117)
Bilirubin Total: 0.3 mg/dL (ref 0.0–1.2)
Bilirubin, Direct: 0.1 mg/dL (ref 0.00–0.40)
TOTAL PROTEIN: 7.1 g/dL (ref 6.0–8.5)

## 2017-10-14 LAB — BASIC METABOLIC PANEL
BUN / CREAT RATIO: 19 (ref 10–24)
BUN: 18 mg/dL (ref 8–27)
CALCIUM: 9.3 mg/dL (ref 8.6–10.2)
CO2: 26 mmol/L (ref 20–29)
Chloride: 103 mmol/L (ref 96–106)
Creatinine, Ser: 0.93 mg/dL (ref 0.76–1.27)
GFR calc non Af Amer: 84 mL/min/{1.73_m2} (ref >59)
GFR, EST AFRICAN AMERICAN: 97 mL/min/{1.73_m2} (ref >59)
GLUCOSE: 117 mg/dL — AB (ref 65–99)
POTASSIUM: 3.9 mmol/L (ref 3.5–5.2)
Sodium: 143 mmol/L (ref 134–144)

## 2017-10-14 LAB — HEMOGLOBIN A1C
ESTIMATED AVERAGE GLUCOSE: 117 mg/dL
HEMOGLOBIN A1C: 5.7 % — AB (ref 4.8–5.6)

## 2017-10-14 LAB — TSH: TSH: 1.72 u[IU]/mL (ref 0.450–4.500)

## 2017-10-14 LAB — HEPATITIS C ANTIBODY

## 2018-03-20 ENCOUNTER — Ambulatory Visit (INDEPENDENT_AMBULATORY_CARE_PROVIDER_SITE_OTHER): Payer: BLUE CROSS/BLUE SHIELD | Admitting: Physician Assistant

## 2018-03-20 ENCOUNTER — Encounter: Payer: Self-pay | Admitting: Physician Assistant

## 2018-03-20 ENCOUNTER — Ambulatory Visit (INDEPENDENT_AMBULATORY_CARE_PROVIDER_SITE_OTHER): Payer: BLUE CROSS/BLUE SHIELD

## 2018-03-20 ENCOUNTER — Other Ambulatory Visit: Payer: Self-pay

## 2018-03-20 VITALS — BP 132/80 | HR 74 | Temp 98.0°F | Resp 18 | Ht 71.0 in | Wt 206.2 lb

## 2018-03-20 DIAGNOSIS — M25511 Pain in right shoulder: Secondary | ICD-10-CM

## 2018-03-20 DIAGNOSIS — I10 Essential (primary) hypertension: Secondary | ICD-10-CM

## 2018-03-20 DIAGNOSIS — M25512 Pain in left shoulder: Secondary | ICD-10-CM | POA: Diagnosis not present

## 2018-03-20 DIAGNOSIS — E7800 Pure hypercholesterolemia, unspecified: Secondary | ICD-10-CM | POA: Diagnosis not present

## 2018-03-20 MED ORDER — AMLODIPINE BESYLATE 10 MG PO TABS: 10.0000 mg | ORAL_TABLET | Freq: Every day | ORAL | 3 refills | Status: DC

## 2018-03-20 MED ORDER — ATORVASTATIN CALCIUM 40 MG PO TABS: 40.0000 mg | ORAL_TABLET | Freq: Every day | ORAL | 3 refills | Status: DC

## 2018-03-20 MED ORDER — PREDNISONE 20 MG PO TABS: ORAL_TABLET | ORAL | 0 refills | Status: AC

## 2018-03-20 NOTE — Patient Instructions (Addendum)
Take pred.  Come back in a month if you need to for your shoulders.  Call if not feeling better in two weeks so I can try some other meds.   Dont take any other pain meds with prednisone aside Tylenol 1000 mg every 8 hours.     IF you received an x-ray today, you will receive an invoice from [facility]. Please contact [facility] at [phone] with questions or concerns regarding your invoice.   IF you received labwork today, you will receive an invoice from [facility]. Please contact [facility] at [phone] with questions or concerns regarding your invoice.   Our billing staff will not be able to assist you with questions regarding bills from these companies.  You will be contacted with the lab results as soon as they are available. The fastest way to get your results is to activate your My Chart account. Instructions are located on the last page of this paperwork. If you have not heard from [place] regarding the results in 2 weeks, please contact this office.
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
## 2018-04-01 ENCOUNTER — Telehealth: Payer: Self-pay | Admitting: Physician Assistant

## 2018-04-01 NOTE — Telephone Encounter (Signed)
Pt. Called requesting refill of prednazone to be sent to usual pharmacy, [facility] [address]

## 2018-04-03 ENCOUNTER — Other Ambulatory Visit: Payer: Self-pay | Admitting: Physician Assistant

## 2018-04-03 DIAGNOSIS — M19011 Primary osteoarthritis, right shoulder: Secondary | ICD-10-CM

## 2018-04-03 DIAGNOSIS — M19012 Primary osteoarthritis, left shoulder: Principal | ICD-10-CM

## 2018-04-03 MED ORDER — TRAMADOL HCL 50 MG PO TABS: 50.0000 mg | ORAL_TABLET | Freq: Three times a day (TID) | ORAL | 0 refills | Status: DC | PRN

## 2018-04-03 NOTE — Telephone Encounter (Signed)
Patient advised.

## 2018-04-03 NOTE — Telephone Encounter (Signed)
I need to get him in with orthopedics for further evaluation and management.  [facility]  He is failed meloxicam and now prednisone.  I can send in some tramadol to help with pain while waiting for a referral.  I am sending that in now.

## 2018-04-03 NOTE — Telephone Encounter (Signed)
Please advise 

## 2018-04-03 NOTE — Telephone Encounter (Signed)
mychart message sent to pt about making an apt for more refills °

## 2018-04-03 NOTE — Telephone Encounter (Signed)
Pt states he was seen 2 weeks ago and given the predniSONE (DELTASONE) 20 MG tablet  At that time and was advised by Deliah BostonMichael Clark that if this didn't help to call back for a refill.

## 2018-04-03 NOTE — Telephone Encounter (Signed)
Patient needs office visit for refill of prednisone. Please call patient to schedule.

## 2018-04-10 ENCOUNTER — Encounter: Payer: Self-pay | Admitting: Physician Assistant

## 2018-04-20 ENCOUNTER — Ambulatory Visit (INDEPENDENT_AMBULATORY_CARE_PROVIDER_SITE_OTHER): Payer: BLUE CROSS/BLUE SHIELD

## 2018-04-20 ENCOUNTER — Ambulatory Visit (INDEPENDENT_AMBULATORY_CARE_PROVIDER_SITE_OTHER): Payer: BLUE CROSS/BLUE SHIELD | Admitting: Physician Assistant

## 2018-04-20 ENCOUNTER — Encounter (INDEPENDENT_AMBULATORY_CARE_PROVIDER_SITE_OTHER): Payer: Self-pay | Admitting: Physician Assistant

## 2018-04-20 DIAGNOSIS — M542 Cervicalgia: Secondary | ICD-10-CM
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
## 2018-04-21 ENCOUNTER — Other Ambulatory Visit (INDEPENDENT_AMBULATORY_CARE_PROVIDER_SITE_OTHER): Payer: Self-pay

## 2018-04-21 DIAGNOSIS — M436 Torticollis: Secondary | ICD-10-CM

## 2018-04-21 DIAGNOSIS — M542 Cervicalgia: Secondary | ICD-10-CM

## 2018-04-25 ENCOUNTER — Telehealth: Payer: Self-pay | Admitting: Nurse Practitioner

## 2018-04-25 NOTE — Telephone Encounter (Signed)
Phone call to patient to verify medication list and allergies for myelogram procedure. Pt informed he will need to stop Tramadol 48hrs prior to myelogram procedure. Pt verbalized understanding.

## 2018-05-03 ENCOUNTER — Ambulatory Visit
Admission: RE | Admit: 2018-05-03 | Discharge: 2018-05-03 | Disposition: A | Discharge status: Home / Self Care | Payer: BLUE CROSS/BLUE SHIELD | Source: Ambulatory Visit | Attending: Orthopaedic Surgery | Admitting: Orthopaedic Surgery

## 2018-05-03 DIAGNOSIS — M436 Torticollis: Secondary | ICD-10-CM

## 2018-05-03 DIAGNOSIS — M542 Cervicalgia: Secondary | ICD-10-CM

## 2018-05-03 MED ORDER — IOPAMIDOL (ISOVUE-M 300) INJECTION 61%: 10.0000 mL | Freq: Once | INTRAMUSCULAR | Status: DC | PRN

## 2018-05-03 MED ORDER — ONDANSETRON HCL 4 MG/2ML IJ SOLN: 4.0000 mg | Freq: Four times a day (QID) | INTRAMUSCULAR | Status: DC | PRN

## 2018-05-03 MED ORDER — DIAZEPAM 5 MG PO TABS
5.0000 mg | ORAL_TABLET | Freq: Once | ORAL | Status: AC
  Administered 2018-05-03: 5 mg via ORAL

## 2018-05-03 NOTE — Discharge Instructions (Signed)
Myelogram Discharge Instructions  1. Go home and rest quietly for the next 24 hours.  It is important to lie flat for the next 24 hours.  Get up only to go to the restroom.  You may lie in the bed or on a couch on your back, your stomach, your left side or your right side.  You may have one pillow under your head.  You may have pillows between your knees while you are on your side or under your knees while you are on your back.  2. DO NOT drive today.  Recline the seat as far back as it will go, while still wearing your seat belt, on the way home.  3. You may get up to go to the bathroom as needed.  You may sit up for 10 minutes to eat.  You may resume your normal diet and medications unless otherwise indicated.  Drink lots of extra fluids today and tomorrow.  4. The incidence of headache, nausea, or vomiting is about 5% (one in 20 patients).  If you develop a headache, lie flat and drink plenty of fluids until the headache goes away.  Caffeinated beverages may be helpful.  If you develop severe nausea and vomiting or a headache that does not go away with flat bed rest, call [phone].  5. You may resume normal activities after your 24 hours of bed rest is over; however, do not exert yourself strongly or do any heavy lifting tomorrow. If when you get up you have a headache when standing, go back to bed and force fluids for another 24 hours.  6. Call your physician for a follow-up appointment.  The results of your myelogram will be sent directly to your physician by the following day.  7. If you have any questions or if complications develop after you arrive home, please call [phone].  Discharge instructions have been explained to the patient.  The patient, or the person responsible for the patient, fully understands these instructions.  YOU MAY RESTART YOUR TRAMADOL TOMORROW 05/04/2018 AT 1:00 PM.

## 2018-05-08 ENCOUNTER — Encounter (INDEPENDENT_AMBULATORY_CARE_PROVIDER_SITE_OTHER): Payer: Self-pay | Admitting: Physician Assistant

## 2018-05-08 ENCOUNTER — Ambulatory Visit (INDEPENDENT_AMBULATORY_CARE_PROVIDER_SITE_OTHER): Payer: BLUE CROSS/BLUE SHIELD | Admitting: Physician Assistant

## 2018-05-08 ENCOUNTER — Other Ambulatory Visit (INDEPENDENT_AMBULATORY_CARE_PROVIDER_SITE_OTHER): Payer: Self-pay

## 2018-05-08 DIAGNOSIS — M542 Cervicalgia: Secondary | ICD-10-CM

## 2018-05-08 DIAGNOSIS — M5412 Radiculopathy, cervical region: Secondary | ICD-10-CM

## 2018-05-08 DIAGNOSIS — M4802 Spinal stenosis, cervical region: Secondary | ICD-10-CM | POA: Diagnosis not present

## 2018-05-08 MED ORDER — METHYLPREDNISOLONE 4 MG PO TABS: ORAL_TABLET | ORAL | 0 refills | Status: DC
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
## 2018-05-26 ENCOUNTER — Encounter (INDEPENDENT_AMBULATORY_CARE_PROVIDER_SITE_OTHER): Payer: Self-pay | Admitting: Physical Medicine and Rehabilitation

## 2018-05-26 ENCOUNTER — Ambulatory Visit (INDEPENDENT_AMBULATORY_CARE_PROVIDER_SITE_OTHER): Payer: BLUE CROSS/BLUE SHIELD | Admitting: Physical Medicine and Rehabilitation

## 2018-05-26 DIAGNOSIS — M25511 Pain in right shoulder: Secondary | ICD-10-CM

## 2018-05-26 DIAGNOSIS — M5412 Radiculopathy, cervical region: Secondary | ICD-10-CM | POA: Diagnosis not present

## 2018-05-26 DIAGNOSIS — R202 Paresthesia of skin: Secondary | ICD-10-CM

## 2018-05-26 DIAGNOSIS — M25649 Stiffness of unspecified hand, not elsewhere classified: Secondary | ICD-10-CM

## 2018-05-26 DIAGNOSIS — M25512 Pain in left shoulder: Secondary | ICD-10-CM

## 2018-05-26 DIAGNOSIS — G8929 Other chronic pain: Secondary | ICD-10-CM

## 2018-05-26 NOTE — Progress Notes (Signed)
 .  Numeric Pain Rating Scale and Functional Assessment Average Pain 7   In the last MONTH (on 0-10 scale) has pain interfered with the following?  1. General activity like being  able to carry out your everyday physical activities such as walking, climbing stairs, carrying groceries, or moving a chair?  Rating(5)   

## 2018-05-29 DIAGNOSIS — M25511 Pain in right shoulder: Secondary | ICD-10-CM | POA: Diagnosis not present

## 2018-05-29 DIAGNOSIS — M25512 Pain in left shoulder: Secondary | ICD-10-CM | POA: Diagnosis not present

## 2018-05-29 DIAGNOSIS — G8929 Other chronic pain: Secondary | ICD-10-CM | POA: Diagnosis not present

## 2018-05-29 MED ORDER — TRIAMCINOLONE ACETONIDE 40 MG/ML IJ SUSP
40.0000 mg | INTRAMUSCULAR | Status: AC | PRN
  Administered 2018-05-29: 40 mg via INTRA_ARTICULAR

## 2018-05-29 MED ORDER — BUPIVACAINE HCL 0.25 % IJ SOLN
4.0000 mL | INTRAMUSCULAR | Status: AC | PRN
  Administered 2018-05-29: 4 mL via INTRA_ARTICULAR

## 2018-05-29 NOTE — Procedures (Signed)
EMG & NCV Findings: Evaluation of the left median (across palm) sensory nerve showed no response (Palm) and prolonged distal peak latency (3.8 ms).  The right median (across palm) sensory nerve showed prolonged distal peak latency (Wrist, 3.8 ms).  The left ulnar sensory nerve showed prolonged distal peak latency (3.8 ms) and decreased conduction velocity (Wrist-5th Digit, 37 m/s).  All remaining nerves (as indicated in the following tables) were within normal limits.  Left vs. Right side comparison data for the ulnar motor nerve indicates abnormal L-R latency difference (0.8 ms).  All remaining left vs. right side differences were within normal limits.    All examined muscles (as indicated in the following table) showed no evidence of electrical instability.    Impression: The above electrodiagnostic study is ABNORMAL and reveals evidence of:  1.  A borderline to mild Bilateral median nerve entrapment at the wrist affecting sensory components.    2. A borderline to mild Left ulnar nerve entrapment at the wrist affecting sensory components.  There is no significant electrodiagnostic evidence of any other focal nerve entrapment, brachial plexopathy or cervical radiculopathy.   Recommendations: 1.  Follow-up with referring physician. 2.  Continue current management of symptoms.  ___________________________ Ray PlummerFred Naja Patel FAAPMR Board Certified, American Board of Physical Medicine and Rehabilitation    Nerve Conduction Studies Anti Sensory Summary Table   Stim Site NR Peak (ms) Norm Peak (ms) P-T Amp (V) Norm P-T Amp Site1 Site2 Delta-P (ms) Dist (cm) Vel (m/s) Norm Vel (m/s)  Left Median Acr Palm Anti Sensory (2nd Digit)  32.6C  Wrist    *3.8 <3.6 22.5 >10 Wrist Palm  0.0    Palm *NR  <2.0          Right Median Acr Palm Anti Sensory (2nd Digit)  32.7C  Wrist    *3.8 <3.6 17.8 >10 Wrist Palm 2.9 0.0    Palm    0.9 <2.0 7.1         Left Radial Anti Sensory (Base 1st Digit)  32.4C    Wrist    2.3 <3.1 16.7  Wrist Base 1st Digit 2.3 0.0    Right Radial Anti Sensory (Base 1st Digit)  31.5C  Wrist    2.5 <3.1 21.8  Wrist Base 1st Digit 2.5 0.0    Left Ulnar Anti Sensory (5th Digit)  32.8C  Wrist    *3.8 <3.7 18.9 >15.0 Wrist 5th Digit 3.8 14.0 *37 >38  Right Ulnar Anti Sensory (5th Digit)  32.7C  Wrist    3.5 <3.7 17.2 >15.0 Wrist 5th Digit 3.5 14.0 40 >38   Motor Summary Table   Stim Site NR Onset (ms) Norm Onset (ms) O-P Amp (mV) Norm O-P Amp Site1 Site2 Delta-0 (ms) Dist (cm) Vel (m/s) Norm Vel (m/s)  Left Median Motor (Abd Poll Brev)  32.5C  Wrist    3.3 <4.2 5.5 >5 Elbow Wrist 4.4 22.0 50 >50  Elbow    7.7  4.3         Right Median Motor (Abd Poll Brev)  32.1C  Wrist    3.6 <4.2 5.6 >5 Elbow Wrist 4.4 22.0 50 >50  Elbow    8.0  5.0         Left Ulnar Motor (Abd Dig Min)  32.5C  Wrist    3.8 <4.2 5.9 >3 B Elbow Wrist 3.9 21.0 54 >53  B Elbow    7.7  5.2  A Elbow B Elbow 1.5 10.0 67 >53  A Elbow  9.2  4.8         Right Ulnar Motor (Abd Dig Min)  32.3C  Wrist    3.0 <4.2 5.8 >3 B Elbow Wrist 4.0 21.0 53 >53  B Elbow    7.0  5.7  A Elbow B Elbow 1.3 10.0 77 >53  A Elbow    8.3  5.5          EMG   Side Muscle Nerve Root Ins Act Fibs Psw Amp Dur Poly Recrt Int Dennie Bible Comment  Right Abd Poll Brev Median C8-T1 Nml Nml Nml Nml Nml 0 Nml Nml   Right 1stDorInt Ulnar C8-T1 Nml Nml Nml Nml Nml 0 Nml Nml   Right PronatorTeres Median C6-7 Nml Nml Nml Nml Nml 0 Nml Nml   Right Biceps Musculocut C5-6 Nml Nml Nml Nml Nml 0 Nml Nml   Right Deltoid Axillary C5-6 Nml Nml Nml Nml Nml 0 Nml Nml   Left Abd Poll Brev Median C8-T1 Nml Nml Nml Nml Nml 0 Nml Nml   Left 1stDorInt Ulnar C8-T1 Nml Nml Nml Nml Nml 0 Nml Nml   Left PronatorTeres Median C6-7 Nml Nml Nml Nml Nml 0 Nml Nml   Left Biceps Musculocut C5-6 Nml Nml Nml Nml Nml 0 Nml Nml   Left Deltoid Axillary C5-6 Nml Nml Nml Nml Nml 0 Nml Nml     Nerve Conduction Studies Anti Sensory Left/Right Comparison   Stim  Site L Lat (ms) R Lat (ms) L-R Lat (ms) L Amp (V) R Amp (V) L-R Amp (%) Site1 Site2 L Vel (m/s) R Vel (m/s) L-R Vel (m/s)  Median Acr Palm Anti Sensory (2nd Digit)  32.6C  Wrist *3.8 *3.8 0.0 22.5 17.8 20.9 Wrist Palm     Palm  0.9   7.1        Radial Anti Sensory (Base 1st Digit)  32.4C  Wrist 2.3 2.5 0.2 16.7 21.8 23.4 Wrist Base 1st Digit     Ulnar Anti Sensory (5th Digit)  32.8C  Wrist *3.8 3.5 0.3 18.9 17.2 9.0 Wrist 5th Digit *37 40 3   Motor Left/Right Comparison   Stim Site L Lat (ms) R Lat (ms) L-R Lat (ms) L Amp (mV) R Amp (mV) L-R Amp (%) Site1 Site2 L Vel (m/s) R Vel (m/s) L-R Vel (m/s)  Median Motor (Abd Poll Brev)  32.5C  Wrist 3.3 3.6 0.3 5.5 5.6 1.8 Elbow Wrist 50 50 0  Elbow 7.7 8.0 0.3 4.3 5.0 14.0       Ulnar Motor (Abd Dig Min)  32.5C  Wrist 3.8 3.0 *0.8 5.9 5.8 1.7 B Elbow Wrist 54 53 1  B Elbow 7.7 7.0 0.7 5.2 5.7 8.8 A Elbow B Elbow 67 77 10  A Elbow 9.2 8.3 0.9 4.8 5.5 12.7          Waveforms:
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
## 2018-06-10 ENCOUNTER — Telehealth: Payer: Self-pay | Admitting: Physician Assistant

## 2018-06-12 NOTE — Telephone Encounter (Signed)
Hygroton 25 mg refill Last Refill:10/13/17 # 45 Last OV: 10/13/17 PCP: Deliah BostonMichael Clark Pharmacy:[facility] [phone]

## 2018-06-21 ENCOUNTER — Other Ambulatory Visit (INDEPENDENT_AMBULATORY_CARE_PROVIDER_SITE_OTHER): Payer: Self-pay

## 2018-06-21 MED ORDER — METHYLPREDNISOLONE 4 MG PO TABS: ORAL_TABLET | ORAL | 0 refills | Status: DC

## 2018-06-21 NOTE — Telephone Encounter (Signed)
Rx request 

## 2018-07-17 ENCOUNTER — Ambulatory Visit (INDEPENDENT_AMBULATORY_CARE_PROVIDER_SITE_OTHER): Payer: BLUE CROSS/BLUE SHIELD | Admitting: Specialist

## 2018-07-17 ENCOUNTER — Telehealth (INDEPENDENT_AMBULATORY_CARE_PROVIDER_SITE_OTHER): Payer: Self-pay | Admitting: Specialist

## 2018-07-17 ENCOUNTER — Encounter (INDEPENDENT_AMBULATORY_CARE_PROVIDER_SITE_OTHER): Payer: Self-pay | Admitting: Specialist

## 2018-07-17 VITALS — BP 154/90 | HR 61 | Ht 71.0 in | Wt 206.0 lb

## 2018-07-17 DIAGNOSIS — M7502 Adhesive capsulitis of left shoulder: Secondary | ICD-10-CM

## 2018-07-17 DIAGNOSIS — M47812 Spondylosis without myelopathy or radiculopathy, cervical region: Secondary | ICD-10-CM | POA: Diagnosis not present

## 2018-07-17 MED ORDER — DICLOFENAC SODIUM 50 MG PO TBEC: 50.0000 mg | DELAYED_RELEASE_TABLET | Freq: Three times a day (TID) | ORAL | 3 refills | Status: DC

## 2018-07-17 MED ORDER — GABAPENTIN 100 MG PO CAPS: 100.0000 mg | ORAL_CAPSULE | Freq: Three times a day (TID) | ORAL | 3 refills | Status: DC

## 2018-07-17 NOTE — Telephone Encounter (Signed)
Patient would like to be put on cancellation list, he was told to follow up in 4 weeks (08/14/18) He would like a morning appt if possible # [phone]

## 2018-07-17 NOTE — Patient Instructions (Addendum)
Avoid overhead lifting and overhead use of the arms. Do not lift greater than 10 lbs. Adjust head rest in vehicle to prevent hyperextension if rear ended. Take extra precautions to avoid falling. Avoid overhead lifting and overhead use of the arms. Do not lift greater than 10 lbs. Diclofenac one every 8 hours for pain and inflamation.. PT for another 4-6 weeks, then a home exercise program.
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
## 2018-07-18 NOTE — Telephone Encounter (Signed)
I put him on the cancellation list 

## 2018-07-19 ENCOUNTER — Other Ambulatory Visit: Payer: Self-pay

## 2018-07-19 MED ORDER — CHLORTHALIDONE 25 MG PO TABS: 12.5000 mg | ORAL_TABLET | Freq: Every day | ORAL | 0 refills | Status: DC

## 2018-07-19 NOTE — Telephone Encounter (Signed)
Pt called in to follow up on refill request for BP medication. Pt says that he has been out for a month now. Pt is scheduled to establish care w/ Creta LevinStallings. Spoke with Steward DroneBrenda in office and was advised to have pt scheduled and then send note to office for refill.    Pharmacy:  [facility] [phone]#[id number] [place], [place] - [phone] [facility] [address] [facility] [phone] (Phone) [phone] (Fax)

## 2018-07-19 NOTE — Telephone Encounter (Signed)
Rx sent in due to having an apt

## 2018-07-29 ENCOUNTER — Ambulatory Visit (INDEPENDENT_AMBULATORY_CARE_PROVIDER_SITE_OTHER): Payer: BLUE CROSS/BLUE SHIELD | Admitting: Family Medicine

## 2018-07-29 ENCOUNTER — Other Ambulatory Visit: Payer: Self-pay

## 2018-07-29 ENCOUNTER — Encounter: Payer: Self-pay | Admitting: Family Medicine

## 2018-07-29 VITALS — BP 126/81 | HR 72 | Temp 98.0°F | Resp 17 | Ht 71.0 in | Wt 204.8 lb

## 2018-07-29 DIAGNOSIS — Z23 Encounter for immunization: Secondary | ICD-10-CM

## 2018-07-29 DIAGNOSIS — E785 Hyperlipidemia, unspecified: Secondary | ICD-10-CM

## 2018-07-29 DIAGNOSIS — M9981 Other biomechanical lesions of cervical region: Secondary | ICD-10-CM

## 2018-07-29 DIAGNOSIS — Z1211 Encounter for screening for malignant neoplasm of colon: Secondary | ICD-10-CM

## 2018-07-29 DIAGNOSIS — I10 Essential (primary) hypertension: Secondary | ICD-10-CM

## 2018-07-29 DIAGNOSIS — Z131 Encounter for screening for diabetes mellitus: Secondary | ICD-10-CM

## 2018-07-29 DIAGNOSIS — Z5181 Encounter for therapeutic drug level monitoring: Secondary | ICD-10-CM

## 2018-07-29 DIAGNOSIS — T783XXA Angioneurotic edema, initial encounter: Secondary | ICD-10-CM

## 2018-07-29 DIAGNOSIS — E7800 Pure hypercholesterolemia, unspecified: Secondary | ICD-10-CM

## 2018-07-29 DIAGNOSIS — M4802 Spinal stenosis, cervical region: Secondary | ICD-10-CM

## 2018-07-29 DIAGNOSIS — R739 Hyperglycemia, unspecified: Secondary | ICD-10-CM

## 2018-07-29 MED ORDER — AMLODIPINE BESYLATE 10 MG PO TABS: 10.0000 mg | ORAL_TABLET | Freq: Every day | ORAL | 3 refills | Status: AC

## 2018-07-29 MED ORDER — ATENOLOL 25 MG PO TABS: 12.0000 mg | ORAL_TABLET | Freq: Every day | ORAL | 0 refills | Status: AC

## 2018-07-29 MED ORDER — ATORVASTATIN CALCIUM 40 MG PO TABS: 40.0000 mg | ORAL_TABLET | Freq: Every day | ORAL | 3 refills | Status: DC

## 2018-07-29 MED ORDER — ZOSTER VAC RECOMB ADJUVANTED 50 MCG/0.5ML IM SUSR: 0.5000 mL | Freq: Once | INTRAMUSCULAR | 0 refills | Status: AC

## 2018-07-29 MED ORDER — CHLORTHALIDONE 25 MG PO TABS
12.5000 mg | ORAL_TABLET | Freq: Every day | ORAL | 3 refills | Status: AC
Start: 2018-07-29 — End: ?

## 2018-07-29 NOTE — Patient Instructions (Addendum)
     If you have lab work done today you will be contacted with your lab results within the next 2 weeks.  If you have not heard from us then please contact us. The fastest way to get your results is to register for My Chart.   IF you received an x-ray today, you will receive an invoice from [facility]. Please contact [facility] at [phone] with questions or concerns regarding your invoice.   IF you received labwork today, you will receive an invoice from [facility]. Please contact [facility] at [phone] with questions or concerns regarding your invoice.   Our billing staff will not be able to assist you with questions regarding bills from these companies.  You will be contacted with the lab results as soon as they are available. The fastest way to get your results is to activate your My Chart account. Instructions are located on the last page of this paperwork. If you have not heard from us regarding the results in 2 weeks, please contact this office.     Colorectal Cancer Screening Colorectal cancer screening is a group of tests used to check for colorectal cancer. Colorectal refers to your colon and rectum. Your colon and rectum are located at the end of your large intestine and carry your bowel movements out of your body. Why is colorectal cancer screening done? It is common for abnormal growths (polyps) to form in the lining of your colon, especially as you get older. These polyps can be cancerous or become cancerous. If colorectal cancer is found at an early stage, it is treatable. Who should be screened for colorectal cancer? Screening is recommended for all adults at average risk starting at age 50. Tests may be recommended every 1 to 10 years. Your health care provider may recommend earlier or more frequent screening if you have:  A history of colorectal cancer or polyps.  A family member with a history of colorectal cancer or polyps.  Inflammatory bowel  disease, such as ulcerative colitis or Crohn disease.  A type of hereditary colon cancer syndrome.  Colorectal cancer symptoms.  Types of screening tests There are several types of colorectal screening tests. They include:  Guaiac-based fecal occult blood testing.  Fecal immunochemical test (FIT).  Stool DNA test.  Barium enema.  Virtual colonoscopy.  Sigmoidoscopy. During this test, a sigmoidoscope is used to examine your rectum and lower colon. A sigmoidoscope is a flexible tube with a camera that is inserted through your anus into your rectum and lower colon.  Colonoscopy. During this test, a colonoscope is used to examine your entire colon. A colonoscope is a long, thin, flexible tube with a camera. This test examines your entire colon and rectum.  This information is not intended to replace advice given to you by your health care provider. Make sure you discuss any questions you have with your health care provider. Document Released: 04/07/2010 Document Revised: 05/27/2016 Document Reviewed: 01/24/2014 Elsevier Interactive Patient Education  2018 Elsevier Inc.  
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
## 2018-07-31 ENCOUNTER — Other Ambulatory Visit: Payer: Self-pay | Admitting: Family Medicine

## 2018-07-31 DIAGNOSIS — I10 Essential (primary) hypertension: Secondary | ICD-10-CM

## 2018-07-31 LAB — LIPID PANEL
CHOL/HDL RATIO: 3.8 ratio (ref 0.0–5.0)
Cholesterol, Total: 144 mg/dL (ref 100–199)
HDL: 38 mg/dL — ABNORMAL LOW (ref >39)
LDL Calculated: 84 mg/dL (ref 0–99)
TRIGLYCERIDES: 109 mg/dL (ref 0–149)
VLDL Cholesterol Cal: 22 mg/dL (ref 5–40)

## 2018-07-31 LAB — COMPREHENSIVE METABOLIC PANEL
ALBUMIN: 4 g/dL (ref 3.6–4.8)
ALK PHOS: 102 IU/L (ref 39–117)
ALT: 24 IU/L (ref 0–44)
AST: 22 IU/L (ref 0–40)
Albumin/Globulin Ratio: 1.5 (ref 1.2–2.2)
BILIRUBIN TOTAL: 0.4 mg/dL (ref 0.0–1.2)
BUN/Creatinine Ratio: 22 (ref 10–24)
BUN: 18 mg/dL (ref 8–27)
CHLORIDE: 100 mmol/L (ref 96–106)
CO2: 25 mmol/L (ref 20–29)
Calcium: 9 mg/dL (ref 8.6–10.2)
Creatinine, Ser: 0.81 mg/dL (ref 0.76–1.27)
GFR calc non Af Amer: 91 mL/min/{1.73_m2} (ref >59)
GFR, EST AFRICAN AMERICAN: 106 mL/min/{1.73_m2} (ref >59)
GLOBULIN, TOTAL: 2.7 g/dL (ref 1.5–4.5)
Glucose: 128 mg/dL — ABNORMAL HIGH (ref 65–99)
Potassium: 3.6 mmol/L (ref 3.5–5.2)
SODIUM: 141 mmol/L (ref 134–144)
TOTAL PROTEIN: 6.7 g/dL (ref 6.0–8.5)

## 2018-07-31 LAB — HEMOGLOBIN A1C
ESTIMATED AVERAGE GLUCOSE: 126 mg/dL
HEMOGLOBIN A1C: 6 % — AB (ref 4.8–5.6)

## 2018-07-31 NOTE — Telephone Encounter (Signed)
Last refilled for atenolol 25 mg tab was 07/29/18 for 5 tablet.  LOV  07/29/18 PCP  [facility] # [phone]

## 2018-08-01 ENCOUNTER — Ambulatory Visit: Payer: BLUE CROSS/BLUE SHIELD | Attending: Specialist | Admitting: Physical Therapy

## 2018-08-01 ENCOUNTER — Encounter: Payer: Self-pay | Admitting: Physical Therapy

## 2018-08-01 DIAGNOSIS — M25512 Pain in left shoulder: Secondary | ICD-10-CM | POA: Insufficient documentation

## 2018-08-01 DIAGNOSIS — M25611 Stiffness of right shoulder, not elsewhere classified: Secondary | ICD-10-CM | POA: Insufficient documentation

## 2018-08-01 DIAGNOSIS — M542 Cervicalgia: Secondary | ICD-10-CM

## 2018-08-01 DIAGNOSIS — M25612 Stiffness of left shoulder, not elsewhere classified: Secondary | ICD-10-CM | POA: Insufficient documentation

## 2018-08-01 DIAGNOSIS — M5412 Radiculopathy, cervical region: Secondary | ICD-10-CM | POA: Diagnosis present
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
## 2018-08-04 ENCOUNTER — Ambulatory Visit: Payer: BLUE CROSS/BLUE SHIELD | Admitting: Physical Therapy

## 2018-08-04 DIAGNOSIS — M5412 Radiculopathy, cervical region: Secondary | ICD-10-CM

## 2018-08-04 DIAGNOSIS — M542 Cervicalgia: Secondary | ICD-10-CM | POA: Diagnosis not present

## 2018-08-04 DIAGNOSIS — M25612 Stiffness of left shoulder, not elsewhere classified: Secondary | ICD-10-CM

## 2018-08-04 DIAGNOSIS — M25611 Stiffness of right shoulder, not elsewhere classified: Secondary | ICD-10-CM
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
## 2018-08-08 ENCOUNTER — Ambulatory Visit: Payer: BLUE CROSS/BLUE SHIELD | Admitting: Physical Therapy

## 2018-08-08 DIAGNOSIS — M542 Cervicalgia: Secondary | ICD-10-CM | POA: Diagnosis not present

## 2018-08-08 DIAGNOSIS — M25611 Stiffness of right shoulder, not elsewhere classified: Secondary | ICD-10-CM

## 2018-08-08 DIAGNOSIS — M25612 Stiffness of left shoulder, not elsewhere classified: Secondary | ICD-10-CM

## 2018-08-08 DIAGNOSIS — M5412 Radiculopathy, cervical region: Secondary | ICD-10-CM
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
## 2018-08-09 ENCOUNTER — Other Ambulatory Visit (INDEPENDENT_AMBULATORY_CARE_PROVIDER_SITE_OTHER): Payer: Self-pay | Admitting: Orthopaedic Surgery

## 2018-08-09 MED ORDER — METHYLPREDNISOLONE 4 MG PO TABS: ORAL_TABLET | ORAL | 0 refills | Status: DC

## 2018-08-09 NOTE — Telephone Encounter (Signed)
Please advise 

## 2018-08-11 ENCOUNTER — Ambulatory Visit: Payer: BLUE CROSS/BLUE SHIELD | Admitting: Physical Therapy

## 2018-08-11 DIAGNOSIS — M542 Cervicalgia: Secondary | ICD-10-CM

## 2018-08-11 DIAGNOSIS — M5412 Radiculopathy, cervical region: Secondary | ICD-10-CM

## 2018-08-11 DIAGNOSIS — M25512 Pain in left shoulder: Secondary | ICD-10-CM

## 2018-08-11 DIAGNOSIS — M25611 Stiffness of right shoulder, not elsewhere classified: Secondary | ICD-10-CM

## 2018-08-11 DIAGNOSIS — M25612 Stiffness of left shoulder, not elsewhere classified: Secondary | ICD-10-CM

## 2018-08-11 NOTE — Patient Instructions (Signed)
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
## 2018-08-16 ENCOUNTER — Ambulatory Visit: Payer: BLUE CROSS/BLUE SHIELD | Admitting: Physical Therapy

## 2018-08-16 ENCOUNTER — Encounter: Payer: Self-pay | Admitting: Physical Therapy

## 2018-08-16 DIAGNOSIS — M25512 Pain in left shoulder: Secondary | ICD-10-CM

## 2018-08-16 DIAGNOSIS — M542 Cervicalgia: Secondary | ICD-10-CM | POA: Diagnosis not present

## 2018-08-16 DIAGNOSIS — M25611 Stiffness of right shoulder, not elsewhere classified: Secondary | ICD-10-CM

## 2018-08-16 DIAGNOSIS — M25612 Stiffness of left shoulder, not elsewhere classified: Secondary | ICD-10-CM

## 2018-08-16 DIAGNOSIS — M5412 Radiculopathy, cervical region: Secondary | ICD-10-CM
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
## 2018-08-18 ENCOUNTER — Ambulatory Visit: Payer: BLUE CROSS/BLUE SHIELD | Admitting: Physical Therapy

## 2018-08-18 ENCOUNTER — Encounter: Payer: Self-pay | Admitting: Physical Therapy

## 2018-08-18 DIAGNOSIS — M542 Cervicalgia: Secondary | ICD-10-CM

## 2018-08-18 DIAGNOSIS — M25512 Pain in left shoulder: Secondary | ICD-10-CM

## 2018-08-18 DIAGNOSIS — M25612 Stiffness of left shoulder, not elsewhere classified: Secondary | ICD-10-CM

## 2018-08-18 DIAGNOSIS — M25611 Stiffness of right shoulder, not elsewhere classified: Secondary | ICD-10-CM

## 2018-08-18 DIAGNOSIS — M5412 Radiculopathy, cervical region: Secondary | ICD-10-CM
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
## 2018-08-22 ENCOUNTER — Other Ambulatory Visit (INDEPENDENT_AMBULATORY_CARE_PROVIDER_SITE_OTHER): Payer: Self-pay | Admitting: Specialist

## 2018-08-22 NOTE — Telephone Encounter (Signed)
Gabapentin refill request 

## 2018-08-25 ENCOUNTER — Ambulatory Visit: Payer: BLUE CROSS/BLUE SHIELD | Admitting: Physical Therapy

## 2018-08-25 ENCOUNTER — Encounter: Payer: Self-pay | Admitting: Physical Therapy

## 2018-08-25 DIAGNOSIS — M25512 Pain in left shoulder: Secondary | ICD-10-CM

## 2018-08-25 DIAGNOSIS — M25612 Stiffness of left shoulder, not elsewhere classified: Secondary | ICD-10-CM

## 2018-08-25 DIAGNOSIS — M542 Cervicalgia: Secondary | ICD-10-CM

## 2018-08-25 DIAGNOSIS — M5412 Radiculopathy, cervical region: Secondary | ICD-10-CM
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
## 2018-08-28 ENCOUNTER — Ambulatory Visit: Payer: BLUE CROSS/BLUE SHIELD | Admitting: Physical Therapy

## 2018-08-28 DIAGNOSIS — M5412 Radiculopathy, cervical region: Secondary | ICD-10-CM

## 2018-08-28 DIAGNOSIS — M542 Cervicalgia: Secondary | ICD-10-CM

## 2018-08-28 DIAGNOSIS — M25612 Stiffness of left shoulder, not elsewhere classified: Secondary | ICD-10-CM

## 2018-08-28 DIAGNOSIS — M25611 Stiffness of right shoulder, not elsewhere classified: Secondary | ICD-10-CM

## 2018-08-28 DIAGNOSIS — M25512 Pain in left shoulder: Secondary | ICD-10-CM
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
## 2018-09-01 ENCOUNTER — Ambulatory Visit: Payer: BLUE CROSS/BLUE SHIELD | Attending: Specialist | Admitting: Physical Therapy

## 2018-09-01 ENCOUNTER — Encounter: Payer: Self-pay | Admitting: Physical Therapy

## 2018-09-01 DIAGNOSIS — M25611 Stiffness of right shoulder, not elsewhere classified: Secondary | ICD-10-CM

## 2018-09-01 DIAGNOSIS — M25612 Stiffness of left shoulder, not elsewhere classified: Secondary | ICD-10-CM | POA: Diagnosis present

## 2018-09-01 DIAGNOSIS — M5412 Radiculopathy, cervical region: Secondary | ICD-10-CM | POA: Diagnosis present

## 2018-09-01 DIAGNOSIS — M542 Cervicalgia: Secondary | ICD-10-CM | POA: Diagnosis present

## 2018-09-01 DIAGNOSIS — M25512 Pain in left shoulder: Secondary | ICD-10-CM | POA: Diagnosis present
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
## 2018-09-04 ENCOUNTER — Other Ambulatory Visit (INDEPENDENT_AMBULATORY_CARE_PROVIDER_SITE_OTHER): Payer: Self-pay | Admitting: Specialist

## 2018-09-04 NOTE — Telephone Encounter (Signed)
Gabapentin refill request 

## 2018-09-06 ENCOUNTER — Ambulatory Visit: Payer: BLUE CROSS/BLUE SHIELD | Admitting: Physical Therapy

## 2018-09-06 ENCOUNTER — Encounter: Payer: Self-pay | Admitting: Physical Therapy

## 2018-09-06 DIAGNOSIS — M5412 Radiculopathy, cervical region: Secondary | ICD-10-CM

## 2018-09-06 DIAGNOSIS — M542 Cervicalgia: Secondary | ICD-10-CM

## 2018-09-06 DIAGNOSIS — M25612 Stiffness of left shoulder, not elsewhere classified: Secondary | ICD-10-CM
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
## 2018-09-08 ENCOUNTER — Telehealth (INDEPENDENT_AMBULATORY_CARE_PROVIDER_SITE_OTHER): Payer: Self-pay | Admitting: Specialist

## 2018-09-08 ENCOUNTER — Encounter (INDEPENDENT_AMBULATORY_CARE_PROVIDER_SITE_OTHER): Payer: Self-pay | Admitting: Specialist

## 2018-09-08 ENCOUNTER — Ambulatory Visit (INDEPENDENT_AMBULATORY_CARE_PROVIDER_SITE_OTHER): Payer: BLUE CROSS/BLUE SHIELD | Admitting: Specialist

## 2018-09-08 VITALS — BP 125/67 | HR 67 | Ht 71.0 in | Wt 206.0 lb

## 2018-09-08 DIAGNOSIS — G5622 Lesion of ulnar nerve, left upper limb: Secondary | ICD-10-CM

## 2018-09-08 DIAGNOSIS — M75122 Complete rotator cuff tear or rupture of left shoulder, not specified as traumatic: Secondary | ICD-10-CM | POA: Diagnosis not present

## 2018-09-08 DIAGNOSIS — M25512 Pain in left shoulder: Secondary | ICD-10-CM

## 2018-09-08 DIAGNOSIS — G5601 Carpal tunnel syndrome, right upper limb: Secondary | ICD-10-CM

## 2018-09-08 DIAGNOSIS — G5602 Carpal tunnel syndrome, left upper limb: Secondary | ICD-10-CM | POA: Diagnosis not present

## 2018-09-08 DIAGNOSIS — M47812 Spondylosis without myelopathy or radiculopathy, cervical region: Secondary | ICD-10-CM

## 2018-09-08 DIAGNOSIS — G8929 Other chronic pain: Secondary | ICD-10-CM

## 2018-09-08 MED ORDER — METHYLPREDNISOLONE 4 MG PO TABS: ORAL_TABLET | ORAL | 0 refills | Status: AC

## 2018-09-08 MED ORDER — DICLOFENAC SODIUM 1 % TD GEL: 2.0000 g | Freq: Four times a day (QID) | TRANSDERMAL | 3 refills | Status: DC
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
## 2018-09-08 NOTE — Telephone Encounter (Signed)
I put him on the cancellation list 

## 2018-09-08 NOTE — Patient Instructions (Addendum)
Avoid overhead lifting and overhead use of the arms. Do not lift greater than 10 lbs. Tylenol ES one every 6-8 hours for pain and inflamation. Voltaren gel 2-3 grams rubbed into the hands 3-4 times per day.

## 2018-09-08 NOTE — Telephone Encounter (Signed)
Patient was given a 3 week f/u appt and 1st available was 10/23/18.  Please add to cancellation list.

## 2018-09-12 ENCOUNTER — Other Ambulatory Visit (INDEPENDENT_AMBULATORY_CARE_PROVIDER_SITE_OTHER): Payer: Self-pay | Admitting: Specialist

## 2018-09-12 NOTE — Telephone Encounter (Signed)
Gabapentin refill request 

## 2018-09-21 ENCOUNTER — Other Ambulatory Visit (INDEPENDENT_AMBULATORY_CARE_PROVIDER_SITE_OTHER): Payer: Self-pay | Admitting: Specialist

## 2018-09-21 NOTE — Telephone Encounter (Signed)
Gabapentin refill request 

## 2018-09-25 ENCOUNTER — Ambulatory Visit
Admission: RE | Admit: 2018-09-25 | Discharge: 2018-09-25 | Disposition: A | Discharge status: Home / Self Care | Payer: BLUE CROSS/BLUE SHIELD | Source: Ambulatory Visit | Attending: Specialist | Admitting: Specialist

## 2018-09-25 DIAGNOSIS — G8929 Other chronic pain: Secondary | ICD-10-CM

## 2018-09-25 DIAGNOSIS — M75122 Complete rotator cuff tear or rupture of left shoulder, not specified as traumatic: Secondary | ICD-10-CM

## 2018-09-25 DIAGNOSIS — M25512 Pain in left shoulder: Secondary | ICD-10-CM

## 2018-09-25 MED ORDER — IOHEXOL 180 MG/ML  SOLN
12.0000 mL | Freq: Once | INTRAMUSCULAR | Status: AC | PRN
  Administered 2018-09-25: 12 mL via INTRA_ARTICULAR

## 2018-10-01 ENCOUNTER — Other Ambulatory Visit (INDEPENDENT_AMBULATORY_CARE_PROVIDER_SITE_OTHER): Payer: Self-pay | Admitting: Specialist

## 2018-10-02 NOTE — Telephone Encounter (Signed)
Diclofenac refill request 

## 2018-10-09 ENCOUNTER — Other Ambulatory Visit (INDEPENDENT_AMBULATORY_CARE_PROVIDER_SITE_OTHER): Payer: Self-pay | Admitting: Orthopaedic Surgery

## 2018-10-09 MED ORDER — METHYLPREDNISOLONE 4 MG PO TABS: ORAL_TABLET | ORAL | 0 refills | Status: DC

## 2018-10-09 NOTE — Telephone Encounter (Signed)
Please advise 

## 2018-10-22 ENCOUNTER — Other Ambulatory Visit (INDEPENDENT_AMBULATORY_CARE_PROVIDER_SITE_OTHER): Payer: Self-pay | Admitting: Specialist

## 2018-10-23 ENCOUNTER — Other Ambulatory Visit (INDEPENDENT_AMBULATORY_CARE_PROVIDER_SITE_OTHER): Payer: Self-pay | Admitting: Specialist

## 2018-10-23 ENCOUNTER — Encounter (INDEPENDENT_AMBULATORY_CARE_PROVIDER_SITE_OTHER): Payer: Self-pay | Admitting: Specialist

## 2018-10-23 ENCOUNTER — Ambulatory Visit (INDEPENDENT_AMBULATORY_CARE_PROVIDER_SITE_OTHER): Payer: BLUE CROSS/BLUE SHIELD | Admitting: Specialist

## 2018-10-23 VITALS — BP 144/72 | HR 69 | Ht 71.0 in | Wt 206.0 lb

## 2018-10-23 DIAGNOSIS — M7582 Other shoulder lesions, left shoulder: Secondary | ICD-10-CM | POA: Diagnosis not present

## 2018-10-23 DIAGNOSIS — M25512 Pain in left shoulder: Secondary | ICD-10-CM | POA: Diagnosis not present

## 2018-10-23 MED ORDER — CELECOXIB 200 MG PO CAPS: 200.0000 mg | ORAL_CAPSULE | Freq: Two times a day (BID) | ORAL | 3 refills | Status: DC

## 2018-10-23 MED ORDER — DICLOFENAC SODIUM 1 % TD GEL: 2.0000 g | Freq: Four times a day (QID) | TRANSDERMAL | 3 refills | Status: DC

## 2018-10-23 NOTE — Patient Instructions (Signed)
Plan: Avoid overhead lifting and overhead use of the arms. Do not lift greater than 10 lbs. Celebrex one every 12 hours for pain and inflamation. Home exercise program.
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
## 2018-10-23 NOTE — Telephone Encounter (Signed)
Diclofenac refill request 

## 2018-10-24 MED ORDER — BUPIVACAINE HCL 0.25 % IJ SOLN
4.0000 mL | INTRAMUSCULAR | Status: AC | PRN
  Administered 2018-10-23: 4 mL via INTRA_ARTICULAR

## 2018-10-24 MED ORDER — METHYLPREDNISOLONE ACETATE 40 MG/ML IJ SUSP
40.0000 mg | INTRAMUSCULAR | Status: AC | PRN
  Administered 2018-10-23: 40 mg via INTRA_ARTICULAR

## 2018-11-01 ENCOUNTER — Other Ambulatory Visit: Payer: Self-pay | Admitting: Physician Assistant

## 2018-11-01 DIAGNOSIS — E785 Hyperlipidemia, unspecified: Secondary | ICD-10-CM

## 2018-11-01 DIAGNOSIS — E7800 Pure hypercholesterolemia, unspecified: Secondary | ICD-10-CM

## 2018-11-24 ENCOUNTER — Other Ambulatory Visit (INDEPENDENT_AMBULATORY_CARE_PROVIDER_SITE_OTHER): Payer: Self-pay | Admitting: Specialist

## 2018-11-24 ENCOUNTER — Telehealth (INDEPENDENT_AMBULATORY_CARE_PROVIDER_SITE_OTHER): Payer: Self-pay | Admitting: Specialist

## 2018-11-24 NOTE — Telephone Encounter (Signed)
Patient dropped off form to be completed by Dr. Otelia Sergeant  stating he can drive while taking the Gabapentin. Patient said the company need the form back as soon as possible. Patient also he need the form back no later than 12/01/2018. The number to contact patient is [phone]

## 2018-11-24 NOTE — Telephone Encounter (Signed)
Form has been given to Dr. Otelia SergeantNitka

## 2018-11-24 NOTE — Telephone Encounter (Signed)
Diclofenac Gel refill request 

## 2018-11-29 ENCOUNTER — Telehealth (INDEPENDENT_AMBULATORY_CARE_PROVIDER_SITE_OTHER): Payer: Self-pay | Admitting: Specialist

## 2018-11-29 NOTE — Telephone Encounter (Signed)
Pt has come in office to drop off by a paper to be released for work and needs the paper fax filed out and called or emailed to his job by Friday 12/01/2018. Says this is his second attempt to get this done the paper has been placed in Dr. Otelia Sergeant mailbox.

## 2018-11-30 ENCOUNTER — Telehealth (INDEPENDENT_AMBULATORY_CARE_PROVIDER_SITE_OTHER): Payer: Self-pay | Admitting: Specialist

## 2018-11-30 NOTE — Telephone Encounter (Signed)
Pt left fax number for his paperwork [phone].

## 2018-12-01 ENCOUNTER — Encounter (INDEPENDENT_AMBULATORY_CARE_PROVIDER_SITE_OTHER): Payer: Self-pay | Admitting: Specialist

## 2018-12-01 NOTE — Telephone Encounter (Signed)
I called and advised that I have faxed the note to [employer] for him. 

## 2018-12-01 NOTE — Telephone Encounter (Signed)
I called and advised that I have faxed the note to [facility] for him.

## 2018-12-04 ENCOUNTER — Other Ambulatory Visit: Payer: Self-pay | Admitting: Family Medicine

## 2018-12-04 DIAGNOSIS — E785 Hyperlipidemia, unspecified: Secondary | ICD-10-CM

## 2018-12-04 DIAGNOSIS — E7800 Pure hypercholesterolemia, unspecified: Secondary | ICD-10-CM

## 2018-12-04 NOTE — Telephone Encounter (Signed)
Requested medication (s) are due for refill today: yes  Requested medication (s) are on the active medication list: yes  Last refill:  11/03/18  Future visit scheduled: no  Notes to clinic:  Pt needs to establish care with a new provider for further refills. Called pt and left message to call back to make appointment.    Requested Prescriptions  Pending Prescriptions Disp Refills   atorvastatin (LIPITOR) 40 MG tablet [Pharmacy Med Name: ATORVASTATIN 40MG  TABLETS] 30 tablet 0    Sig: TAKE 1 TABLET BY MOUTH EVERY DAY     Cardiovascular:  Antilipid - Statins Failed - 12/04/2018  3:22 AM      Failed - HDL in normal range and within 360 days    HDL  Date Value Ref Range Status  07/29/2018 38 (L) >39 mg/dL Final         Passed - Total Cholesterol in normal range and within 360 days    Cholesterol, Total  Date Value Ref Range Status  07/29/2018 144 100 - 199 mg/dL Final         Passed - LDL in normal range and within 360 days    LDL Calculated  Date Value Ref Range Status  07/29/2018 84 0 - 99 mg/dL Final         Passed - Triglycerides in normal range and within 360 days    Triglycerides  Date Value Ref Range Status  07/29/2018 109 0 - 149 mg/dL Final         Passed - Patient is not pregnant      Passed - Valid encounter within last 12 months    Recent Outpatient Visits          4 months ago Essential hypertension   [facility], Zoe A, MD   8 months ago Acute pain of both shoulders   [facility], Marolyn Hammock, PA-C   1 year ago Essential hypertension   [facility], Marolyn Hammock, PA-C   1 year ago Essential hypertension   [facility], Marolyn Hammock, PA-C   2 years ago Fever, unspecified   [facility], Marolyn Hammock, PA-C      Future Appointments            In 3 weeks Kerrin Champagne, MD [facility]

## 2018-12-04 NOTE — Telephone Encounter (Signed)
Per message [facility] nurse advised pt

## 2018-12-05 ENCOUNTER — Other Ambulatory Visit (INDEPENDENT_AMBULATORY_CARE_PROVIDER_SITE_OTHER): Payer: Self-pay | Admitting: Orthopaedic Surgery

## 2018-12-05 MED ORDER — METHYLPREDNISOLONE 4 MG PO TABS: ORAL_TABLET | ORAL | 0 refills | Status: DC

## 2018-12-05 NOTE — Telephone Encounter (Signed)
Please advise 

## 2018-12-06 ENCOUNTER — Ambulatory Visit (INDEPENDENT_AMBULATORY_CARE_PROVIDER_SITE_OTHER): Payer: BLUE CROSS/BLUE SHIELD | Admitting: Specialist

## 2018-12-12 ENCOUNTER — Other Ambulatory Visit: Payer: Self-pay | Admitting: Family Medicine

## 2018-12-12 DIAGNOSIS — I10 Essential (primary) hypertension: Secondary | ICD-10-CM

## 2018-12-16 ENCOUNTER — Other Ambulatory Visit: Payer: Self-pay | Admitting: Family Medicine

## 2018-12-16 DIAGNOSIS — E785 Hyperlipidemia, unspecified: Secondary | ICD-10-CM

## 2018-12-16 DIAGNOSIS — E7800 Pure hypercholesterolemia, unspecified: Secondary | ICD-10-CM

## 2018-12-18 MED ORDER — ATORVASTATIN CALCIUM 40 MG PO TABS: 40.0000 mg | ORAL_TABLET | Freq: Every day | ORAL | 0 refills | Status: AC

## 2018-12-25 ENCOUNTER — Ambulatory Visit (INDEPENDENT_AMBULATORY_CARE_PROVIDER_SITE_OTHER): Payer: BLUE CROSS/BLUE SHIELD | Admitting: Specialist

## 2018-12-25 ENCOUNTER — Encounter (INDEPENDENT_AMBULATORY_CARE_PROVIDER_SITE_OTHER): Payer: Self-pay | Admitting: Specialist

## 2018-12-25 VITALS — BP 144/80 | HR 63 | Ht 71.0 in | Wt 206.0 lb

## 2018-12-25 DIAGNOSIS — M7542 Impingement syndrome of left shoulder: Secondary | ICD-10-CM

## 2018-12-25 DIAGNOSIS — M778 Other enthesopathies, not elsewhere classified: Secondary | ICD-10-CM

## 2018-12-25 DIAGNOSIS — M7582 Other shoulder lesions, left shoulder: Secondary | ICD-10-CM | POA: Diagnosis not present

## 2018-12-25 MED ORDER — METHYLPREDNISOLONE ACETATE 40 MG/ML IJ SUSP
40.0000 mg | INTRAMUSCULAR | Status: AC | PRN
  Administered 2018-12-25: 40 mg via INTRA_ARTICULAR

## 2018-12-25 MED ORDER — BUPIVACAINE HCL 0.25 % IJ SOLN
4.0000 mL | INTRAMUSCULAR | Status: AC | PRN
  Administered 2018-12-25: 4 mL via INTRA_ARTICULAR

## 2018-12-25 MED ORDER — CELECOXIB 200 MG PO CAPS: 200.0000 mg | ORAL_CAPSULE | Freq: Two times a day (BID) | ORAL | 3 refills | Status: DC

## 2018-12-25 MED ORDER — GABAPENTIN 100 MG PO CAPS: ORAL_CAPSULE | ORAL | 3 refills | Status: DC

## 2018-12-25 MED ORDER — DICLOFENAC SODIUM 1 % TD GEL: TRANSDERMAL | 3 refills | Status: DC

## 2018-12-25 NOTE — Patient Instructions (Signed)
Avoid overhead lifting and overhead use of the arms. Do not lift greater than 10 lbs. Celebrex 200 mg every 12 hours for pain and inflamation. Call and speak with Neysa Bonito if you are having left shoulder pain and need to consider a third cortisone injection into the left shoulder. Continue with home exercise program.
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
## 2019-01-14 ENCOUNTER — Other Ambulatory Visit (INDEPENDENT_AMBULATORY_CARE_PROVIDER_SITE_OTHER): Payer: Self-pay | Admitting: Specialist

## 2019-01-15 NOTE — Telephone Encounter (Signed)
Gabapentin refill request 

## 2019-02-12 ENCOUNTER — Other Ambulatory Visit (INDEPENDENT_AMBULATORY_CARE_PROVIDER_SITE_OTHER): Payer: Self-pay | Admitting: Specialist

## 2019-02-12 NOTE — Telephone Encounter (Signed)
Celecoxib refill request  

## 2019-02-15 ENCOUNTER — Other Ambulatory Visit (INDEPENDENT_AMBULATORY_CARE_PROVIDER_SITE_OTHER): Payer: Self-pay | Admitting: Specialist

## 2019-02-15 NOTE — Telephone Encounter (Signed)
Diclofenac refill request 

## 2019-03-02 ENCOUNTER — Other Ambulatory Visit (INDEPENDENT_AMBULATORY_CARE_PROVIDER_SITE_OTHER): Payer: Self-pay | Admitting: Orthopaedic Surgery

## 2019-03-02 MED ORDER — METHYLPREDNISOLONE 4 MG PO TABS: ORAL_TABLET | ORAL | 0 refills | Status: DC

## 2019-03-02 NOTE — Telephone Encounter (Signed)
CB pt 

## 2019-03-02 NOTE — Telephone Encounter (Signed)
Error for other message on our pt, please advise for this patient thank you.

## 2019-03-02 NOTE — Telephone Encounter (Signed)
Please advise 

## 2019-03-02 NOTE — Telephone Encounter (Signed)
Nitka patient 

## 2019-03-02 NOTE — Telephone Encounter (Signed)
Ray Patel please advise, thanks.

## 2019-03-12 ENCOUNTER — Other Ambulatory Visit: Payer: Self-pay | Admitting: Family Medicine

## 2019-03-12 DIAGNOSIS — I10 Essential (primary) hypertension: Secondary | ICD-10-CM

## 2019-03-13 ENCOUNTER — Other Ambulatory Visit: Payer: Self-pay | Admitting: Physician Assistant

## 2019-03-13 DIAGNOSIS — I10 Essential (primary) hypertension: Secondary | ICD-10-CM

## 2019-06-02 IMAGING — DX DG SHOULDER 2+V*R*
3 series · 3 of 3 positions shown · non-contrast
Comparison: None.

CLINICAL DATA: Tenderness to palpation, shoulder pain

EXAM:
RIGHT SHOULDER - 2+ VIEW

[shoulder ap]
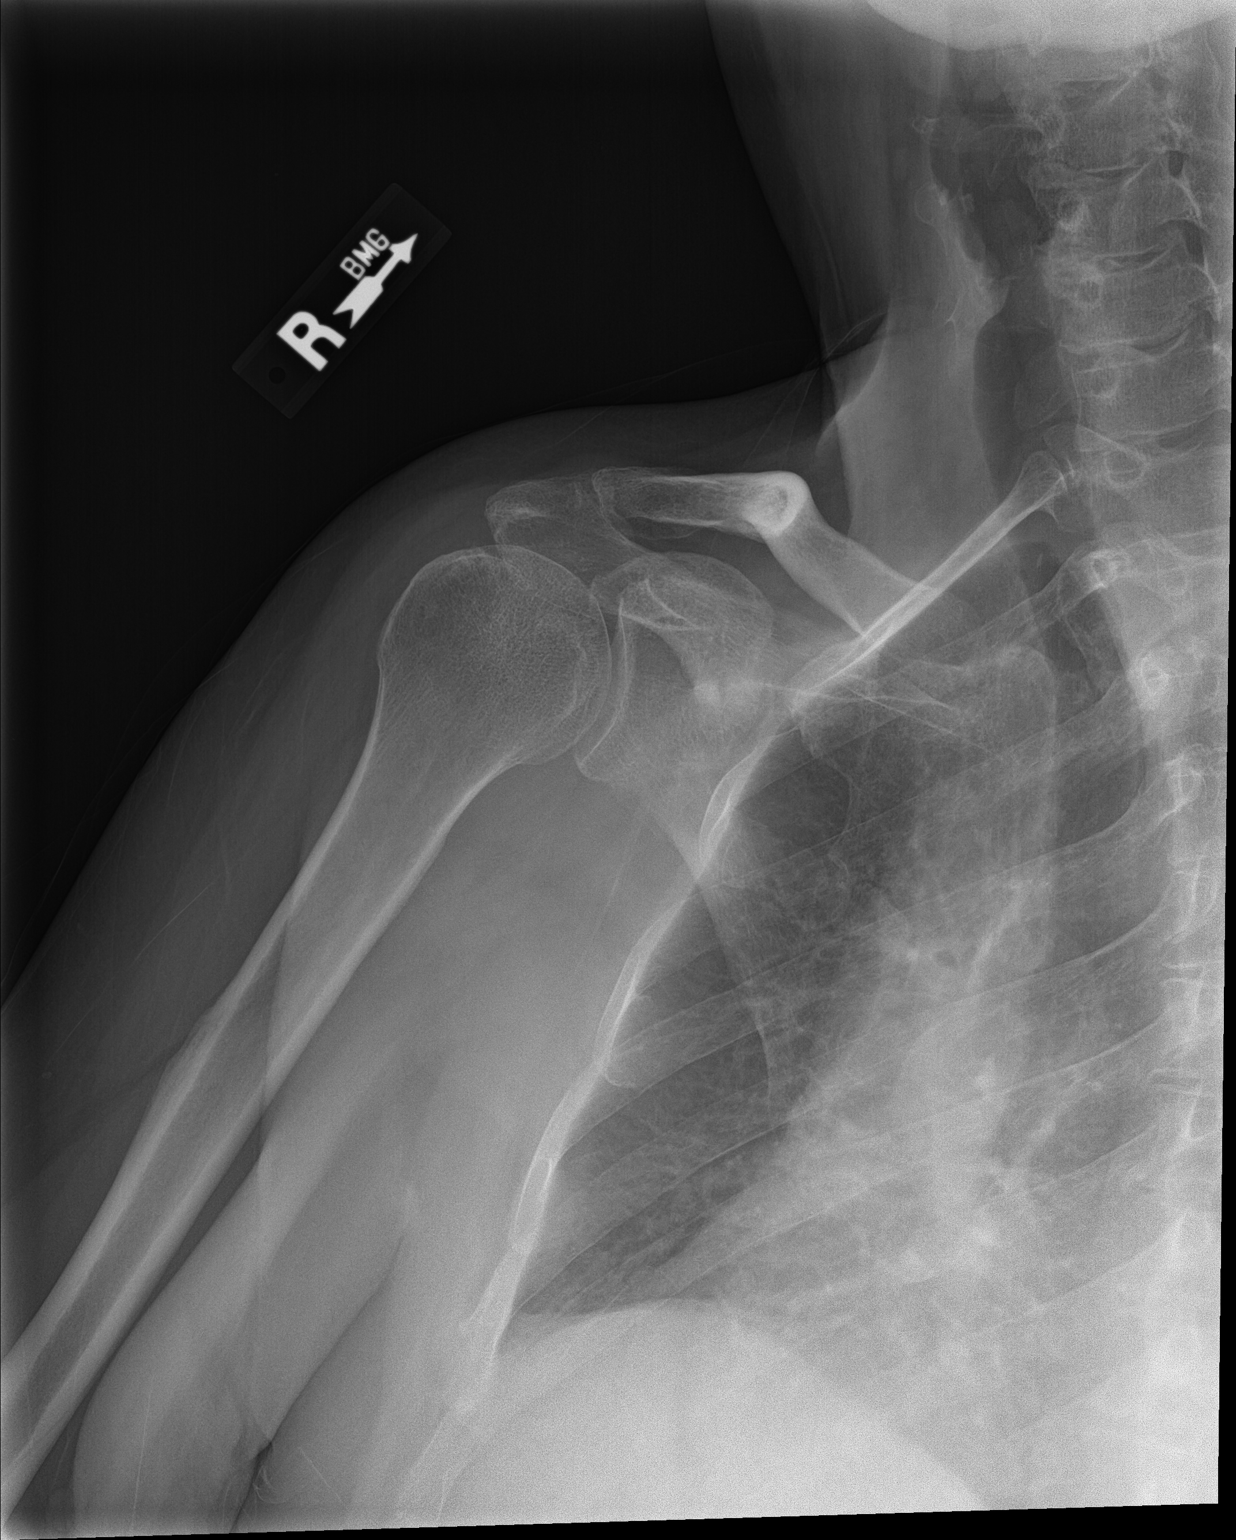

[shoulder y-view]
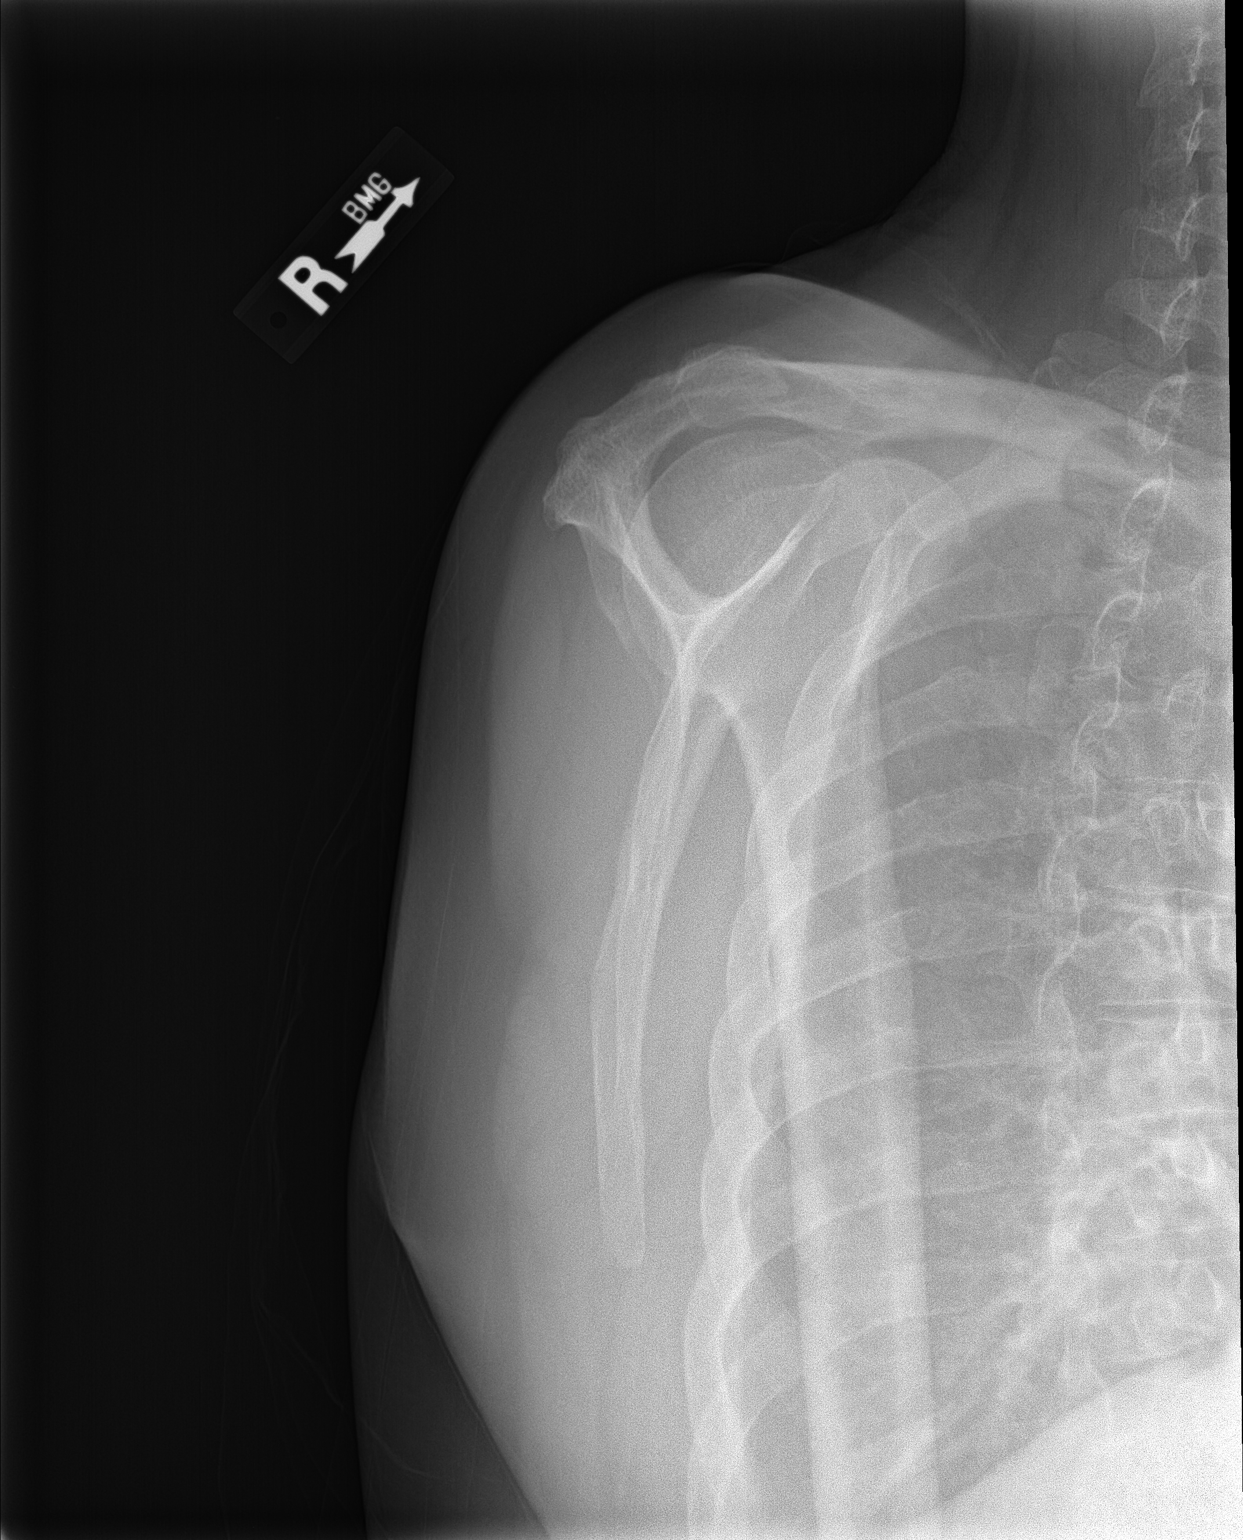

[shoulder axial]
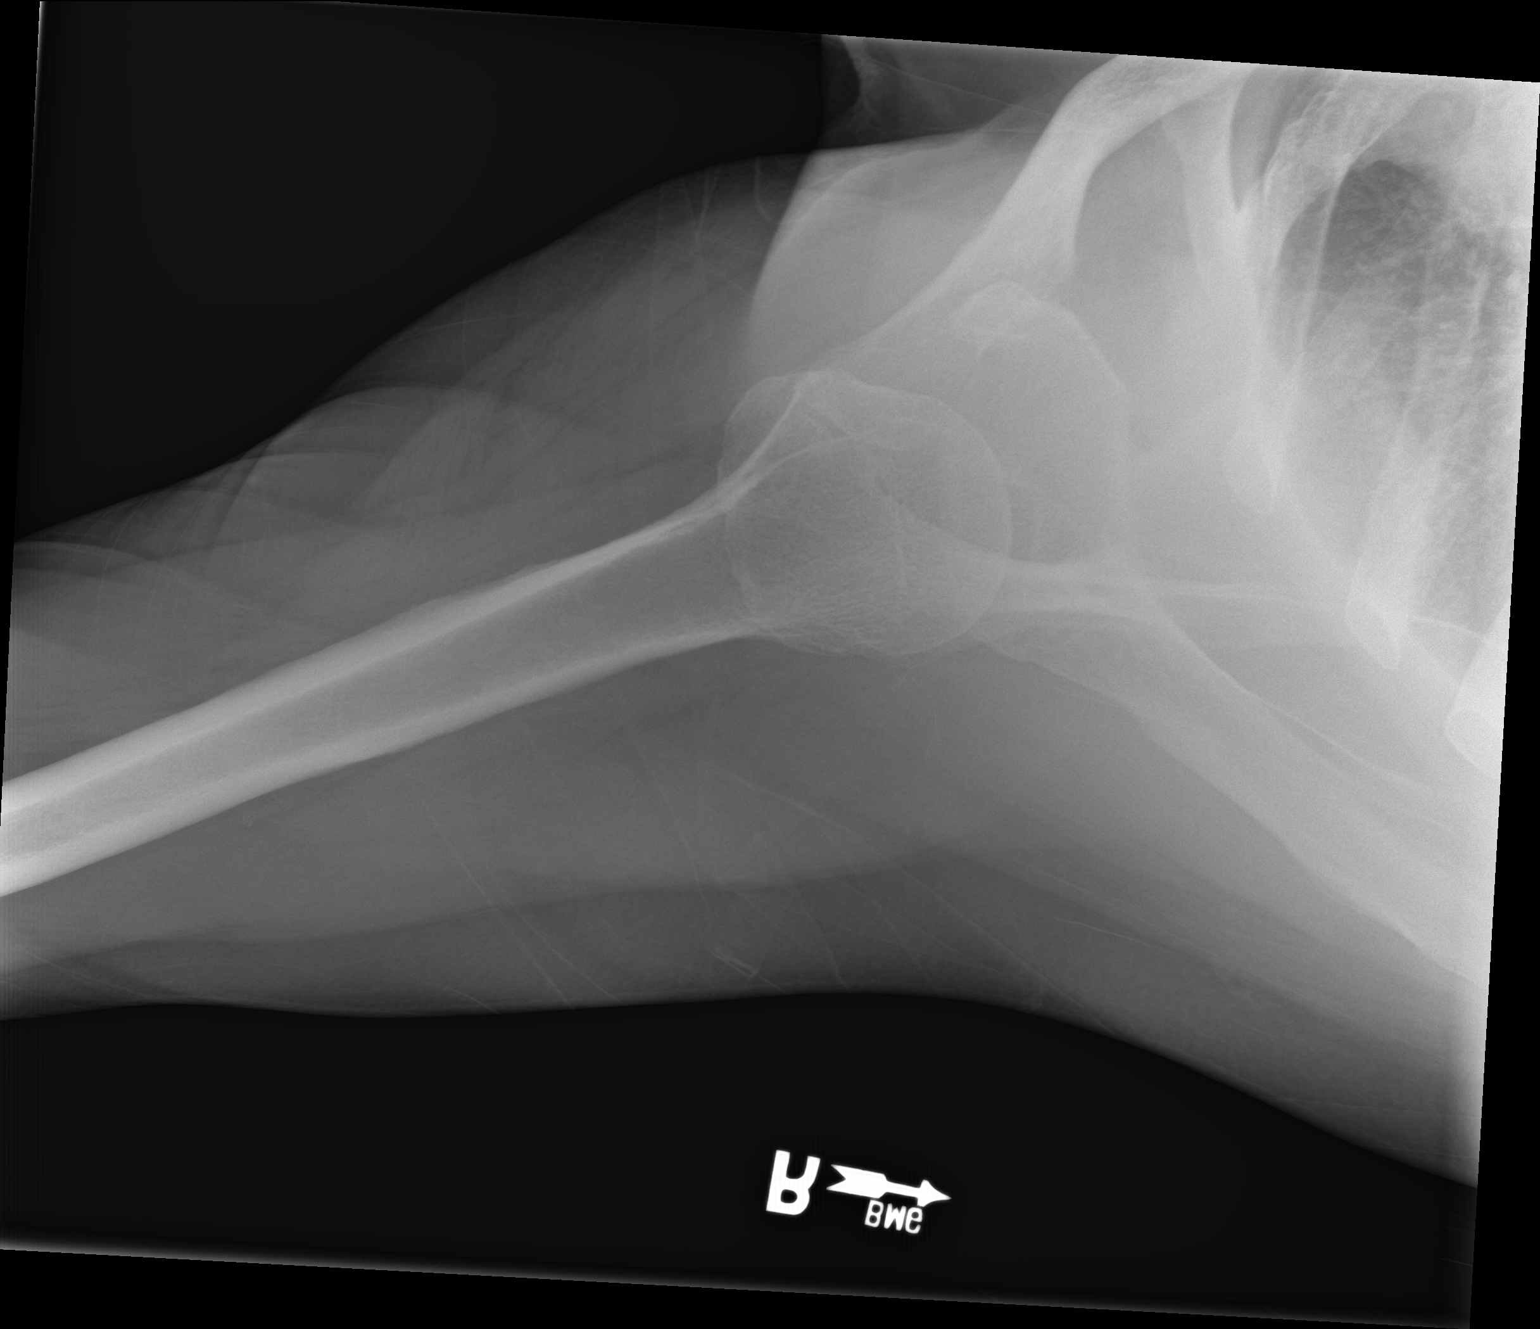

[3 of 3 positions shown; findings below may reference images not displayed]

FINDINGS: There is no fracture or dislocation. The glenohumeral joint is
normal. There are mild degenerative changes of the acromioclavicular
joint.
IMPRESSION: Mild arthropathy of acromioclavicular joint.

## 2019-06-04 ENCOUNTER — Other Ambulatory Visit (INDEPENDENT_AMBULATORY_CARE_PROVIDER_SITE_OTHER): Payer: Self-pay | Admitting: Orthopaedic Surgery

## 2019-06-04 MED ORDER — METHYLPREDNISOLONE 4 MG PO TABS: ORAL_TABLET | ORAL | 0 refills | Status: DC

## 2019-06-04 NOTE — Telephone Encounter (Signed)
Please advise 

## 2019-08-22 ENCOUNTER — Other Ambulatory Visit (INDEPENDENT_AMBULATORY_CARE_PROVIDER_SITE_OTHER): Payer: Self-pay | Admitting: Orthopaedic Surgery

## 2019-08-22 MED ORDER — METHYLPREDNISOLONE 4 MG PO TABS: ORAL_TABLET | ORAL | 0 refills | Status: AC

## 2019-08-22 NOTE — Telephone Encounter (Signed)
Please advise 
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
## 2022-10-11 ENCOUNTER — Ambulatory Visit (HOSPITAL_BASED_OUTPATIENT_CLINIC_OR_DEPARTMENT_OTHER): Payer: No Typology Code available for payment source | Attending: Surgical | Admitting: Physical Therapy

## 2022-10-11 ENCOUNTER — Other Ambulatory Visit: Payer: Self-pay

## 2022-10-11 ENCOUNTER — Encounter (HOSPITAL_BASED_OUTPATIENT_CLINIC_OR_DEPARTMENT_OTHER): Payer: Self-pay | Admitting: Physical Therapy

## 2022-10-11 DIAGNOSIS — M6281 Muscle weakness (generalized): Secondary | ICD-10-CM | POA: Diagnosis present

## 2022-10-11 DIAGNOSIS — M25662 Stiffness of left knee, not elsewhere classified: Secondary | ICD-10-CM | POA: Insufficient documentation

## 2022-10-11 DIAGNOSIS — M25562 Pain in left knee: Secondary | ICD-10-CM | POA: Diagnosis present
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
## 2022-10-14 ENCOUNTER — Encounter (HOSPITAL_BASED_OUTPATIENT_CLINIC_OR_DEPARTMENT_OTHER): Payer: Self-pay | Admitting: Physical Therapy

## 2022-10-14 ENCOUNTER — Ambulatory Visit (HOSPITAL_BASED_OUTPATIENT_CLINIC_OR_DEPARTMENT_OTHER): Payer: No Typology Code available for payment source | Admitting: Physical Therapy

## 2022-10-14 DIAGNOSIS — M25662 Stiffness of left knee, not elsewhere classified: Secondary | ICD-10-CM

## 2022-10-14 DIAGNOSIS — M25562 Pain in left knee: Secondary | ICD-10-CM | POA: Diagnosis not present

## 2022-10-14 DIAGNOSIS — M6281 Muscle weakness (generalized): Secondary | ICD-10-CM
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
## 2022-11-02 ENCOUNTER — Encounter (HOSPITAL_BASED_OUTPATIENT_CLINIC_OR_DEPARTMENT_OTHER): Payer: Self-pay | Admitting: Physical Therapy

## 2022-11-02 ENCOUNTER — Ambulatory Visit (HOSPITAL_BASED_OUTPATIENT_CLINIC_OR_DEPARTMENT_OTHER): Payer: No Typology Code available for payment source | Attending: Surgical | Admitting: Physical Therapy

## 2022-11-02 DIAGNOSIS — M25662 Stiffness of left knee, not elsewhere classified: Secondary | ICD-10-CM | POA: Insufficient documentation

## 2022-11-02 DIAGNOSIS — M25562 Pain in left knee: Secondary | ICD-10-CM | POA: Diagnosis not present

## 2022-11-02 DIAGNOSIS — M6281 Muscle weakness (generalized): Secondary | ICD-10-CM | POA: Diagnosis present
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
## 2022-11-04 ENCOUNTER — Encounter (HOSPITAL_BASED_OUTPATIENT_CLINIC_OR_DEPARTMENT_OTHER): Payer: Self-pay | Admitting: Physical Therapy

## 2022-11-04 ENCOUNTER — Ambulatory Visit (HOSPITAL_BASED_OUTPATIENT_CLINIC_OR_DEPARTMENT_OTHER): Payer: No Typology Code available for payment source | Admitting: Physical Therapy

## 2022-11-04 DIAGNOSIS — M25562 Pain in left knee: Secondary | ICD-10-CM

## 2022-11-04 DIAGNOSIS — M6281 Muscle weakness (generalized): Secondary | ICD-10-CM

## 2022-11-04 DIAGNOSIS — M25662 Stiffness of left knee, not elsewhere classified: Secondary | ICD-10-CM
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
## 2022-11-09 ENCOUNTER — Ambulatory Visit (HOSPITAL_BASED_OUTPATIENT_CLINIC_OR_DEPARTMENT_OTHER): Payer: No Typology Code available for payment source | Admitting: Physical Therapy

## 2022-11-09 ENCOUNTER — Encounter (HOSPITAL_BASED_OUTPATIENT_CLINIC_OR_DEPARTMENT_OTHER): Payer: Self-pay | Admitting: Physical Therapy

## 2022-11-09 DIAGNOSIS — M25662 Stiffness of left knee, not elsewhere classified: Secondary | ICD-10-CM

## 2022-11-09 DIAGNOSIS — M25562 Pain in left knee: Secondary | ICD-10-CM | POA: Diagnosis not present

## 2022-11-09 DIAGNOSIS — M6281 Muscle weakness (generalized): Secondary | ICD-10-CM
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
## 2022-11-11 ENCOUNTER — Ambulatory Visit (HOSPITAL_BASED_OUTPATIENT_CLINIC_OR_DEPARTMENT_OTHER): Payer: No Typology Code available for payment source | Admitting: Physical Therapy

## 2022-11-11 ENCOUNTER — Encounter (HOSPITAL_BASED_OUTPATIENT_CLINIC_OR_DEPARTMENT_OTHER): Payer: Self-pay | Admitting: Physical Therapy

## 2022-11-11 DIAGNOSIS — M25562 Pain in left knee: Secondary | ICD-10-CM | POA: Diagnosis not present

## 2022-11-11 DIAGNOSIS — M6281 Muscle weakness (generalized): Secondary | ICD-10-CM
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
## 2022-11-16 ENCOUNTER — Encounter (HOSPITAL_BASED_OUTPATIENT_CLINIC_OR_DEPARTMENT_OTHER): Payer: Self-pay | Admitting: Physical Therapy

## 2022-11-16 ENCOUNTER — Ambulatory Visit (HOSPITAL_BASED_OUTPATIENT_CLINIC_OR_DEPARTMENT_OTHER): Payer: No Typology Code available for payment source | Admitting: Physical Therapy

## 2022-11-16 DIAGNOSIS — M25562 Pain in left knee: Secondary | ICD-10-CM | POA: Diagnosis not present

## 2022-11-16 DIAGNOSIS — M25662 Stiffness of left knee, not elsewhere classified: Secondary | ICD-10-CM

## 2022-11-16 DIAGNOSIS — M6281 Muscle weakness (generalized): Secondary | ICD-10-CM
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
## 2022-11-18 ENCOUNTER — Ambulatory Visit (HOSPITAL_BASED_OUTPATIENT_CLINIC_OR_DEPARTMENT_OTHER): Payer: No Typology Code available for payment source | Admitting: Physical Therapy

## 2022-11-18 ENCOUNTER — Encounter (HOSPITAL_BASED_OUTPATIENT_CLINIC_OR_DEPARTMENT_OTHER): Payer: Self-pay | Admitting: Physical Therapy

## 2022-11-18 DIAGNOSIS — M25562 Pain in left knee: Secondary | ICD-10-CM

## 2022-11-18 DIAGNOSIS — M25662 Stiffness of left knee, not elsewhere classified: Secondary | ICD-10-CM

## 2022-11-18 DIAGNOSIS — M6281 Muscle weakness (generalized): Secondary | ICD-10-CM
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
## 2022-11-23 ENCOUNTER — Ambulatory Visit (HOSPITAL_BASED_OUTPATIENT_CLINIC_OR_DEPARTMENT_OTHER): Payer: No Typology Code available for payment source | Admitting: Physical Therapy

## 2022-11-23 ENCOUNTER — Encounter (HOSPITAL_BASED_OUTPATIENT_CLINIC_OR_DEPARTMENT_OTHER): Payer: Self-pay | Admitting: Physical Therapy

## 2022-11-23 DIAGNOSIS — M25562 Pain in left knee: Secondary | ICD-10-CM

## 2022-11-23 DIAGNOSIS — M6281 Muscle weakness (generalized): Secondary | ICD-10-CM

## 2022-11-23 DIAGNOSIS — M25662 Stiffness of left knee, not elsewhere classified: Secondary | ICD-10-CM
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
## 2022-11-24 ENCOUNTER — Encounter (HOSPITAL_BASED_OUTPATIENT_CLINIC_OR_DEPARTMENT_OTHER): Payer: Self-pay | Admitting: Physical Therapy

## 2022-11-25 ENCOUNTER — Encounter (HOSPITAL_BASED_OUTPATIENT_CLINIC_OR_DEPARTMENT_OTHER): Payer: No Typology Code available for payment source | Admitting: Physical Therapy

## 2022-11-26 ENCOUNTER — Encounter (HOSPITAL_BASED_OUTPATIENT_CLINIC_OR_DEPARTMENT_OTHER): Payer: Self-pay | Admitting: Physical Therapy

## 2022-11-26 ENCOUNTER — Ambulatory Visit (HOSPITAL_BASED_OUTPATIENT_CLINIC_OR_DEPARTMENT_OTHER): Payer: No Typology Code available for payment source | Admitting: Physical Therapy

## 2022-11-26 DIAGNOSIS — M25662 Stiffness of left knee, not elsewhere classified: Secondary | ICD-10-CM

## 2022-11-26 DIAGNOSIS — M25562 Pain in left knee: Secondary | ICD-10-CM | POA: Diagnosis not present

## 2022-11-26 DIAGNOSIS — M6281 Muscle weakness (generalized): Secondary | ICD-10-CM
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
## 2022-11-30 ENCOUNTER — Ambulatory Visit (HOSPITAL_BASED_OUTPATIENT_CLINIC_OR_DEPARTMENT_OTHER): Payer: No Typology Code available for payment source | Admitting: Physical Therapy

## 2022-12-02 ENCOUNTER — Ambulatory Visit (HOSPITAL_BASED_OUTPATIENT_CLINIC_OR_DEPARTMENT_OTHER): Payer: No Typology Code available for payment source | Admitting: Physical Therapy
